# Patient Record
Sex: Female | Born: 1970 | Race: White | Hispanic: No | Marital: Married | Smoking: Never smoker
Health system: Southern US, Community
[De-identification: ages and names within clinical notes are randomized; demographics above are authoritative.]

## PROBLEM LIST (undated history)

## (undated) DIAGNOSIS — T7840XA Allergy, unspecified, initial encounter: Secondary | ICD-10-CM

## (undated) DIAGNOSIS — K219 Gastro-esophageal reflux disease without esophagitis: Secondary | ICD-10-CM

## (undated) HISTORY — DX: Gastro-esophageal reflux disease without esophagitis: K21.9

## (undated) HISTORY — PX: WISDOM TOOTH EXTRACTION: SHX21

## (undated) HISTORY — DX: Allergy, unspecified, initial encounter: T78.40XA

---

## 1994-07-27 HISTORY — PX: WRIST SURGERY: SHX841

## 1996-07-27 HISTORY — PX: SINUSOTOMY: SHX291

## 2000-03-25 ENCOUNTER — Other Ambulatory Visit: Admission: RE | Admit: 2000-03-25 | Discharge: 2000-03-25 | Payer: Self-pay | Admitting: Obstetrics and Gynecology

## 2002-10-02 ENCOUNTER — Encounter: Payer: Self-pay | Admitting: Obstetrics and Gynecology

## 2002-10-02 ENCOUNTER — Ambulatory Visit (HOSPITAL_COMMUNITY): Admission: RE | Admit: 2002-10-02 | Discharge: 2002-10-02 | Payer: Self-pay | Admitting: Obstetrics and Gynecology

## 2003-03-01 ENCOUNTER — Ambulatory Visit (HOSPITAL_COMMUNITY): Admission: RE | Admit: 2003-03-01 | Discharge: 2003-03-01 | Payer: Self-pay | Admitting: Obstetrics and Gynecology

## 2003-03-01 ENCOUNTER — Other Ambulatory Visit: Admission: RE | Admit: 2003-03-01 | Discharge: 2003-03-01 | Payer: Self-pay | Admitting: Obstetrics and Gynecology

## 2003-03-01 ENCOUNTER — Encounter: Payer: Self-pay | Admitting: Obstetrics and Gynecology

## 2003-09-18 ENCOUNTER — Inpatient Hospital Stay (HOSPITAL_COMMUNITY): Admission: RE | Admit: 2003-09-18 | Discharge: 2003-09-21 | Payer: Self-pay | Admitting: Obstetrics and Gynecology

## 2013-08-24 ENCOUNTER — Other Ambulatory Visit: Payer: Self-pay

## 2013-08-24 DIAGNOSIS — Z1231 Encounter for screening mammogram for malignant neoplasm of breast: Secondary | ICD-10-CM

## 2013-09-11 ENCOUNTER — Ambulatory Visit
Admission: RE | Admit: 2013-09-11 | Discharge: 2013-09-11 | Disposition: A | Discharge status: Home / Self Care | Payer: BC Managed Care – PPO | Source: Ambulatory Visit

## 2013-09-11 DIAGNOSIS — Z1231 Encounter for screening mammogram for malignant neoplasm of breast: Secondary | ICD-10-CM

## 2013-09-14 ENCOUNTER — Other Ambulatory Visit: Payer: Self-pay | Admitting: Obstetrics and Gynecology

## 2013-09-14 DIAGNOSIS — R928 Other abnormal and inconclusive findings on diagnostic imaging of breast: Secondary | ICD-10-CM

## 2013-09-27 ENCOUNTER — Ambulatory Visit
Admission: RE | Admit: 2013-09-27 | Discharge: 2013-09-27 | Disposition: A | Discharge status: Home / Self Care | Payer: BC Managed Care – PPO | Source: Ambulatory Visit | Attending: Obstetrics and Gynecology | Admitting: Obstetrics and Gynecology

## 2013-09-27 DIAGNOSIS — R928 Other abnormal and inconclusive findings on diagnostic imaging of breast: Secondary | ICD-10-CM

## 2014-08-03 ENCOUNTER — Ambulatory Visit (INDEPENDENT_AMBULATORY_CARE_PROVIDER_SITE_OTHER): Payer: BLUE CROSS/BLUE SHIELD | Admitting: Family Medicine

## 2014-08-03 VITALS — BP 112/68 | HR 61 | Temp 97.8°F | Resp 16 | Ht 65.25 in | Wt 160.0 lb

## 2014-08-03 DIAGNOSIS — R0602 Shortness of breath: Secondary | ICD-10-CM

## 2014-08-03 DIAGNOSIS — R05 Cough: Secondary | ICD-10-CM

## 2014-08-03 DIAGNOSIS — K122 Cellulitis and abscess of mouth: Secondary | ICD-10-CM

## 2014-08-03 DIAGNOSIS — R059 Cough, unspecified: Secondary | ICD-10-CM

## 2014-08-03 MED ORDER — ALBUTEROL SULFATE HFA 108 (90 BASE) MCG/ACT IN AERS: 2.0000 | INHALATION_SPRAY | RESPIRATORY_TRACT | Status: DC | PRN

## 2014-08-03 MED ORDER — AMOXICILLIN 400 MG/5ML PO SUSR: ORAL | Status: AC

## 2014-08-03 NOTE — Patient Instructions (Signed)
Uvulitis  Uvulitis is redness and soreness (inflammation) of the uvula. The uvula is the small tongue-shaped piece of tissue in the back of your mouth.   CAUSES  Infection is a common cause of uvulitis. Infection of the uvula can be either viral or bacterial. Infectious uvulitis usually only occurs in association with another condition, such as inflammation and infection of the mouth or throat.   Other causes of uvulitis include:  · Trauma to the uvula.  · Swelling from excess fluid buildup (edema), which may be an allergic reaction.  · Inhalation of irritants, such as chemical agents, smoke, or steam.  DIAGNOSIS  Your caregiver can usually diagnose uvulitis through a physical examination. Bacterial uvulitis can be diagnosed through the results of the growth of samples of bodily substances taken from your mouth (cultures).  HOME CARE INSTRUCTIONS   · Rest as much as possible.  · Young children may suck on frozen juice bars or frozen ice pops. Older children and adults may gargle with a warm or cold liquid to help soothe the throat. (Mix ¼ tsp of salt in 8 oz of water, or use strong tea.)  · Use a cool-mist humidifier to lessen throat irritation and cough.  · Drink enough fluids to keep your urine clear or pale yellow.  · While the throat is very sore, eat soft or liquid foods such as milk, ice cream, soups, or milk drinks.  · Family members who develop a sore throat or fever should have a medical exam or throat culture.  · If your child has uvulitis and is taking antibiotic medicine, wait 24 hours or until his or her temperature is near normal (less than 100° F [37.8° C]) before allowing him or her to return to school or day care.  · Only take over-the-counter or prescription medicines for pain, discomfort, or fever as directed by your caregiver.  Ask when your test results will be ready. Make sure you get your test results.   SEEK MEDICAL CARE IF:   · You have an oral temperature above 102° F (38.9° C).  · You  develop large, tender lumps your the neck.  · Your child develops a rash.  · You cough up green, yellow-brown, or bloody substances.  SEEK IMMEDIATE MEDICAL CARE IF:   · You develop any new symptoms, such as vomiting, earache, severe headache, stiff neck, chest pain, or trouble breathing or swallowing.  · Your airway is blocked.  · You develop more severe throat pain along with drooling or voice changes.  Document Released: 02/21/2004 Document Revised: 10/05/2011 Document Reviewed: 09/18/2010  ExitCare® Patient Information ©2015 [employer]. This information is not intended to replace advice given to you by your health care provider. Make sure you discuss any questions you have with your health care provider.

## 2014-08-03 NOTE — Progress Notes (Signed)
    MRN: [id number] DOB: 1971-07-14  Subjective:   Joan Leon is a 44 y.o. female presenting for 2 week history of cough worse at night productive with thick dark green sputum, wheezing, shob. On 07/[facility]/2014[facility] prednisone x5 days, albuterol inhaler (used every 4 hours with significant relief), benzonatate perles. Still has chest tightness, feels choking sensation, tickle in her throat. Associated symptoms include fatigue, fevers (highest 100F), sinus congestion, occasional chest pain, intermittent chest tightness and shob with cough. Denies sinus pain, ear pain, ear fullness, itchy or watery eyes, throat pain, n/v, abdominal pain, diarrhea. Admits seasonal allergies and is taking Claritin for this, denies history of asthma. Fiance has been sick, sinus infection, taking antibiotics; also 3 co-workers, one with very similar symptoms treated with antibiotics. Denies smoking, social alcohol use. Denies any other aggravating or relieving factors, no other questions or concerns.  Judeth CornfieldStephanie has a current medication list which includes the following prescription(s): benzonatate and loratadine.  She has No Known Allergies.  Judeth CornfieldStephanie  has no past medical history on file. Also  has past surgical history that includes Cesarean section.  ROS As in subjective.  Objective:   Vitals: BP 112/68 mmHg  Pulse 61  Temp(Src) 97.8 F (36.6 C) (Oral)  Resp 16  Ht 5' 5.25" (1.657 m)  Wt 160 lb (72.576 kg)  BMI 26.43 kg/m2  SpO2 99%  LMP 07/25/2014  Physical Exam  Constitutional: She is oriented to person, place, and time and well-developed, well-nourished, and in no distress.  HENT:  Right TM cerumen occluded and left TM pearly and intact, both non-tender, no drainage.  Nasal turbinates pink and moist, mild edema. Uvula inflamed and edematous, surround oropharynx erythematous. No tonsillar edema, oropharyngeal exudates or postnasal drip.  Eyes: Conjunctivae and EOM are  normal. Right eye exhibits no discharge. Left eye exhibits no discharge. No scleral icterus.  Neck: Normal range of motion.  Cardiovascular: Normal rate, regular rhythm, normal heart sounds and intact distal pulses.  Exam reveals no gallop and no friction rub.   No murmur heard. Pulmonary/Chest: Effort normal and breath sounds normal. No stridor. No respiratory distress. She has no wheezes. She has no rales. She exhibits no tenderness.  Abdominal: Bowel sounds are normal.  Lymphadenopathy:    She has no cervical adenopathy.  Neurological: She is alert and oriented to person, place, and time.  Skin: Skin is warm and dry. No rash noted. She is not diaphoretic. No erythema.  Psychiatric: Mood and affect normal.   Assessment and Plan :   1. Uvulitis 2. Cough 3. Shortness of breath - Uvulitis likely bacterial given time course and worsening symptoms, will treat empirically covering for Strep. Patient declined strep swab, requested liquid suspension of Amoxicillin. - Cough likely caused from inflammation/edema of uvula and surrounding oropharynx, continue benzonatate, call if refill needed - refill albuterol for shob, PE findings reassuring for r/o cardiopulmonary process, will hold of on X-ray given PE findings, empiric antibiotic tx, consider X-ray if no improvement in symptoms with antibiotic course.  Wallis BambergMario Temara Lanum, PA-C [facility] [phone] 08/03/2014 9:46 AM

## 2014-08-16 ENCOUNTER — Ambulatory Visit (INDEPENDENT_AMBULATORY_CARE_PROVIDER_SITE_OTHER): Payer: BLUE CROSS/BLUE SHIELD | Admitting: Family Medicine

## 2014-08-16 ENCOUNTER — Ambulatory Visit (INDEPENDENT_AMBULATORY_CARE_PROVIDER_SITE_OTHER): Payer: BLUE CROSS/BLUE SHIELD

## 2014-08-16 VITALS — BP 108/64 | HR 55 | Temp 98.3°F | Resp 16 | Ht 65.0 in | Wt 160.8 lb

## 2014-08-16 DIAGNOSIS — J209 Acute bronchitis, unspecified: Secondary | ICD-10-CM

## 2014-08-16 DIAGNOSIS — R059 Cough, unspecified: Secondary | ICD-10-CM

## 2014-08-16 DIAGNOSIS — R05 Cough: Secondary | ICD-10-CM

## 2014-08-16 DIAGNOSIS — R0602 Shortness of breath: Secondary | ICD-10-CM

## 2014-08-16 LAB — POCT CBC
Granulocyte percent: 64.3 %G (ref 37–80)
HCT, POC: 40.4 % (ref 37.7–47.9)
Hemoglobin: 13.2 g/dL (ref 12.2–16.2)
LYMPH, POC: 2.3 (ref 0.6–3.4)
MCH, POC: 30.7 pg (ref 27–31.2)
MCHC: 32.8 g/dL (ref 31.8–35.4)
MCV: 93.7 fL (ref 80–97)
MID (cbc): 0.4 (ref 0–0.9)
MPV: 6.2 fL (ref 0–99.8)
POC Granulocyte: 5 (ref 2–6.9)
POC LYMPH PERCENT: 30 %L (ref 10–50)
POC MID %: 5.7 %M (ref 0–12)
Platelet Count, POC: 355 10*3/uL (ref 142–424)
RBC: 4.32 M/uL (ref 4.04–5.48)
RDW, POC: 13.4 %
WBC: 7.7 10*3/uL (ref 4.6–10.2)

## 2014-08-16 MED ORDER — AZITHROMYCIN 250 MG PO TABS: ORAL_TABLET | ORAL | Status: DC

## 2014-08-16 MED ORDER — PREDNISONE 20 MG PO TABS: ORAL_TABLET | ORAL | Status: DC

## 2014-08-16 MED ORDER — BECLOMETHASONE DIPROPIONATE 40 MCG/ACT IN AERS: 2.0000 | INHALATION_SPRAY | Freq: Two times a day (BID) | RESPIRATORY_TRACT | Status: DC

## 2014-08-16 MED ORDER — ALBUTEROL SULFATE (2.5 MG/3ML) 0.083% IN NEBU
2.5000 mg | INHALATION_SOLUTION | Freq: Once | RESPIRATORY_TRACT | Status: AC
  Administered 2014-08-16: 2.5 mg via RESPIRATORY_TRACT

## 2014-08-16 MED ORDER — ALBUTEROL SULFATE HFA 108 (90 BASE) MCG/ACT IN AERS: 2.0000 | INHALATION_SPRAY | RESPIRATORY_TRACT | Status: DC | PRN

## 2014-08-16 MED ORDER — BENZONATATE 100 MG PO CAPS: 100.0000 mg | ORAL_CAPSULE | Freq: Three times a day (TID) | ORAL | Status: DC | PRN

## 2014-08-16 MED ORDER — HYDROCODONE-HOMATROPINE 5-1.5 MG/5ML PO SYRP: 5.0000 mL | ORAL_SOLUTION | ORAL | Status: DC | PRN

## 2014-08-16 NOTE — Progress Notes (Signed)
     Results for orders placed or performed in visit on 08/16/14  POCT CBC  Result Value Ref Range   WBC 7.7 4.6 - 10.2 K/uL   Lymph, poc 2.3 0.6 - 3.4   POC LYMPH PERCENT 30.0 10 - 50 %L   MID (cbc) 0.4 0 - 0.9   POC MID % 5.7 0 - 12 %M   POC Granulocyte 5.0 2 - 6.9   Granulocyte percent 64.3 37 - 80 %G   RBC 4.32 4.04 - 5.48 M/uL   Hemoglobin 13.2 12.2 - 16.2 g/dL   HCT, POC 09.840.4 11.937.7 - 47.9 %   MCV 93.7 80 - 97 fL   MCH, POC 30.7 27 - 31.2 pg   MCHC 32.8 31.8 - 35.4 g/dL   RDW, POC 14.713.4 %   Platelet Count, POC 355 142 - 424 K/uL   MPV 6.2 0 - 99.8 fL   [facility] reading (PRIMARY) by  Dr. Alwyn RenHopper Normal chest  Will treat with albuterol, steroids, Qvar inhaler, and azithromycin. Return if not significantly improved a week or 10 days from now or if getting worse at anytime.

## 2014-08-16 NOTE — Patient Instructions (Signed)
Drink plenty of fluids  Use the albuterol inhaler 2 inhalations every 4 hours when awake, gradually taper off of it when doing better  Use the Qvar inhaler 2 inhalations twice daily. Recommend continuing that for at least one month  Take the prednisone 3 daily for 2 days, 2 daily for 2 days, one daily for 2 days, one half daily for the remaining 4 days  Take the azithromycin 2 pills initially, then 1 daily for 4 days  Use the Tessalon cough pills if needed  Use Hycodan cough syrup 1 teaspoon every 4-6 hours as needed for cough. Will cause sedation  Return if not substantially improved in the next week or 2, or in any time if necessary.

## 2014-08-29 ENCOUNTER — Ambulatory Visit (INDEPENDENT_AMBULATORY_CARE_PROVIDER_SITE_OTHER): Payer: BLUE CROSS/BLUE SHIELD | Admitting: Physician Assistant

## 2014-08-29 VITALS — BP 108/68 | HR 66 | Temp 98.8°F | Resp 16 | Wt 161.2 lb

## 2014-08-29 DIAGNOSIS — R05 Cough: Secondary | ICD-10-CM

## 2014-08-29 DIAGNOSIS — R059 Cough, unspecified: Secondary | ICD-10-CM

## 2014-08-29 DIAGNOSIS — J029 Acute pharyngitis, unspecified: Secondary | ICD-10-CM

## 2014-08-29 LAB — POCT RAPID STREP A (OFFICE): Rapid Strep A Screen: NEGATIVE

## 2014-08-29 MED ORDER — MAGIC MOUTHWASH W/LIDOCAINE: 10.0000 mL | ORAL | Status: DC | PRN

## 2014-08-29 MED ORDER — OMEPRAZOLE 40 MG PO CPDR: 40.0000 mg | DELAYED_RELEASE_CAPSULE | Freq: Two times a day (BID) | ORAL | Status: DC

## 2014-08-29 MED ORDER — HYDROCOD POLST-CHLORPHEN POLST 10-8 MG/5ML PO LQCR: 5.0000 mL | Freq: Two times a day (BID) | ORAL | Status: DC | PRN

## 2014-08-29 MED ORDER — RANITIDINE HCL 150 MG PO TABS: 150.0000 mg | ORAL_TABLET | Freq: Two times a day (BID) | ORAL | Status: DC

## 2014-08-29 NOTE — Patient Instructions (Signed)
Continue the Claritin, and the inhalers. Get as much rest and drink as much water as you can.  Things that often make reflux symptoms worse: Caffeine Carbonation (soda) Mint Spicy foods Acidic foods (like tomato sauce, orange juice, lemonade) Fatty foods (including whole milk and ice cream) Stress (feeling sad, worried, nervous) Nicotine Alcohol Nonsteroidal anti-inflammatories (NSAIDS)(ibuprofen, naproxen)

## 2014-08-29 NOTE — Progress Notes (Signed)
Subjective:    Patient ID: Joan Leon, female    DOB: [birth year], 44 y.o.   MRN: [id number]   PCP: No PCP Per Patient  Chief Complaint  Patient presents with  . Cough    worse at night  . Fever    chills during the day  . Fatigue    extreme fatigue   . Sore Throat    2-3 days    No Known Allergies  There are no active problems to display for this patient.   Prior to Admission medications   Medication Sig Start Date End Date Taking? Authorizing Provider  albuterol (PROVENTIL HFA;VENTOLIN HFA) 108 (90 BASE) MCG/ACT inhaler Inhale 2 puffs into the lungs every 4 (four) hours as needed for wheezing or shortness of breath (cough, shortness of breath or wheezing.). 08/16/14  Yes Peyton Najjar, MD  beclomethasone (QVAR) 40 MCG/ACT inhaler Inhale 2 puffs into the lungs 2 (two) times daily. 08/16/14  Yes Peyton Najjar, MD  HYDROcodone-homatropine [facility]) 5-1.5 MG/5ML syrup Take 5 mLs by mouth every 4 (four) hours as needed. 08/16/14  Yes Peyton Najjar, MD  loratadine (CLARITIN) 10 MG tablet Take 10 mg by mouth daily.   Yes Historical Provider, MD    Medical, Surgical, Family and Social History reviewed and updated.  HPI  Presents with persistent cough, beginning in December 2015.  The past few nights, she's had to take a double dose of hycodan, and even that only helped a little. Uses [place] cough drops all night. Not as bad during the day, but any kind of attempt at exercise and just going outside exacerbates the cough. She's developed a sore throat and headache, and chest soreness with so much coughing. Uses the prescribed inhalers and can tell when it's been 4 hours since the last albuterol dose because she develops chest tightness. No head congestion, but she describes "non-stop drainage" in her throat.  Some chills. No fever. No nausea, vomiting, diarrhea. No heartburn/indigestion, increased belching.  She is under considerable work stress with very long days. Also  planning her April wedding and honeymoon to [place].  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. On 12/30 she was in [place] with her fiance and presented to a [facility] for evaluation. She was diagnosed with a "bronchial infection" and prescribed analbuterol inhaler, prednisone taper and tessalon perles.  When she did not improve, she presented here on 08/03/2014. At that time, she reported thick dark green sputum, wheezing and SOB. She described chest tightness, choking sensation and tickle in her throat. She also reported fatigue, fever (Tmax 100), sinus congestion. Multiple sick contacts, all improved with antibiotic therapy. On exam, the nasal turbinates were pink and edematous and the uvula was inflamed and edematous. She was diagnosed with uvulitis, and treated empirically with amoxicillin. Albuterol inhaler was refilled, as she found it helpful temporarily.  She returned 08/16/2014 with persistent symptoms. CBC was normal. CXR was reported as normal, but she says she was told that it was "not quite pneumonia." A nebulizer treatment helped considerably. She was prescribed azithromycin, a steroid taper, Qvar inhaler and advised to continue the albuterol inhaler. She was advised to RTC if her symptoms were still present in 10 days.  Review of Systems As above.    Objective:   Physical Exam  Constitutional: She is oriented to person, place, and time. Vital signs are normal. She appears well-developed and well-nourished.  No distress.  BP 108/68 mmHg  Pulse 66  Temp(Src) 98.8 F (37.1 C) (Oral)  Resp 16  Wt 161 lb 3.2 oz (73.12 kg)  SpO2 98%  LMP 08/12/2014   HENT:  Head: Normocephalic and atraumatic.  Right Ear: Hearing, tympanic membrane, external ear and ear canal normal.  Left Ear: Hearing, tympanic membrane, external ear and ear canal normal.  Nose: Mucosal edema (mild) present.  No foreign bodies. Right  sinus exhibits no maxillary sinus tenderness and no frontal sinus tenderness. Left sinus exhibits no maxillary sinus tenderness and no frontal sinus tenderness.  Mouth/Throat: Uvula is midline and mucous membranes are normal. Normal dentition. No uvula swelling. Posterior oropharyngeal erythema (soft palate and uvula with mild erythema) present. No oropharyngeal exudate, posterior oropharyngeal edema or tonsillar abscesses.  Moderate cerumen in the canals bilaterally, but visible TMs are normal.  Eyes: Conjunctivae and EOM are normal. Pupils are equal, round, and reactive to light. Right eye exhibits no discharge. Left eye exhibits no discharge. No scleral icterus.  Neck: Trachea normal, normal range of motion, full passive range of motion without pain and phonation normal. Neck supple. No thyroid mass and no thyromegaly present.  Cardiovascular: Normal rate, regular rhythm and normal heart sounds.   Pulmonary/Chest: Effort normal and breath sounds normal.  Last albuterol dose about 3.5 hours ago  Lymphadenopathy:       Head (right side): No submandibular, no tonsillar, no preauricular, no posterior auricular and no occipital adenopathy present.       Head (left side): No submandibular, no tonsillar, no preauricular and no occipital adenopathy present.    She has no cervical adenopathy.       Right: No supraclavicular adenopathy present.       Left: No supraclavicular adenopathy present.  Neurological: She is alert and oriented to person, place, and time. She has normal strength. No cranial nerve deficit or sensory deficit.  Skin: Skin is warm, dry and intact. No rash noted.  Psychiatric: She has a normal mood and affect. Her speech is normal and behavior is normal.          Assessment & Plan:  1. Acute pharyngitis, unspecified pharyngitis type 2. Cough Suspicious of LPR as cause for cough, given appropriate antibiotic therapy and lack of improvement with prednisone. She has underlying  allergies, but prednisone should have improved symptoms if due to post-nasal drainage. Also consider cyclic cough. Start PPI, and H2 blocker until symptoms improving. Trial of GI cocktail with lidocaine to see if we can suppress the sensation in her throat that she needs to cough. Also suppress cough with Tussionex. Rest. Fluids. Reviewed reflux triggers to avoid (stress is really the only item on the list she identifies as present in her life). If no improvement, plan referral to pulmonology.  - POCT rapid strep A - Culture, Group A Strep - Alum & Mag Hydroxide-Simeth (MAGIC MOUTHWASH W/LIDOCAINE) SOLN; Take 10 mLs by mouth every 2 (two) hours as needed for mouth pain.  Dispense: 360 mL; Refill: 0 - chlorpheniramine-HYDROcodone (TUSSIONEX PENNKINETIC ER) 10-8 MG/5ML LQCR; Take 5 mLs by mouth every 12 (twelve) hours as needed for cough (cough).  Dispense: 100 mL; Refill: 0 - ranitidine (ZANTAC) 150 MG tablet; Take 1 tablet (150 mg total) by mouth 2 (two) times daily. Once symptoms improve, reduce this to once daily, then stop.  Dispense: 60 tablet; Refill: 0 - omeprazole (PRILOSEC) 40 MG capsule; Take 1 capsule (40 mg total) by mouth 2 (two) times daily.  Dispense:  60 capsule; Refill: 3   Discussed with Dr. Neva SeatGreene.  Fernande Brashelle S. Lam Bjorklund, PA-C Physician Assistant-Certified [facility]

## 2014-09-01 LAB — CULTURE, GROUP A STREP: ORGANISM ID, BACTERIA: NORMAL

## 2015-02-28 ENCOUNTER — Ambulatory Visit (INDEPENDENT_AMBULATORY_CARE_PROVIDER_SITE_OTHER): Payer: BLUE CROSS/BLUE SHIELD | Admitting: Physician Assistant

## 2015-02-28 ENCOUNTER — Ambulatory Visit (INDEPENDENT_AMBULATORY_CARE_PROVIDER_SITE_OTHER): Payer: BLUE CROSS/BLUE SHIELD

## 2015-02-28 VITALS — BP 110/58 | HR 73 | Temp 98.1°F | Resp 18 | Ht 65.0 in | Wt 162.0 lb

## 2015-02-28 DIAGNOSIS — R05 Cough: Secondary | ICD-10-CM

## 2015-02-28 DIAGNOSIS — R0602 Shortness of breath: Secondary | ICD-10-CM | POA: Diagnosis not present

## 2015-02-28 DIAGNOSIS — R053 Chronic cough: Secondary | ICD-10-CM

## 2015-02-28 MED ORDER — ALBUTEROL SULFATE HFA 108 (90 BASE) MCG/ACT IN AERS: 2.0000 | INHALATION_SPRAY | RESPIRATORY_TRACT | Status: DC | PRN

## 2015-02-28 MED ORDER — HYDROCOD POLST-CPM POLST ER 10-8 MG/5ML PO SUER: 5.0000 mL | Freq: Every evening | ORAL | Status: DC | PRN

## 2015-02-28 MED ORDER — E-Z SPACER DEVI: Status: DC

## 2015-02-28 NOTE — Progress Notes (Signed)
Subjective:    Patient ID: Joan Leon, female    DOB: 1971-07-27, 44 y.o.   MRN: [id number]  HPI Patient presents for cough that has been present for the past 8 months. Has been treated for URI and bronchitis with only marginal relief of cough. Sx of congestion, myalgias, HA, sinus pressure, sore throat, fever all improved initially and just had productive cough around March, however, in May 2016 initial sx returned. Now just has cough. Cough is constant throughout day and has caused SOB and has irritated throat. Cough is productive at times. Only additionally endorses fatigue and wheezing at times. Is taking claritin and albuterol inhaler. No longer has hycodan or tussionex. Most recent sick contact were ppl on the airplane. Denies HTN, GERD, or CHF. Is not a smoker.   Review of Systems  Constitutional: Positive for fatigue. Negative for fever, chills, activity change and appetite change.  HENT: Negative for congestion, ear discharge, ear pain, rhinorrhea, sinus pressure and sneezing.   Respiratory: Positive for cough, shortness of breath and wheezing. Negative for apnea and chest tightness.   Cardiovascular: Negative for chest pain, palpitations and leg swelling.  Gastrointestinal: Negative for nausea and vomiting.  Skin: Negative.   Neurological: Negative for dizziness, light-headedness and headaches.       Objective:   Physical Exam  Constitutional: She is oriented to person, place, and time. She appears well-developed and well-nourished. No distress.  Blood pressure 110/58, pulse 73, temperature 98.1 F (36.7 C), temperature source Oral, resp. rate 18, height  (1.651 m), weight 162 lb (73.483 kg), last menstrual period 02/14/2015, SpO2 99 %, peak flow 250 L/min.   HENT:  Head: Normocephalic and atraumatic.  Right Ear: Tympanic membrane, external ear and ear canal normal.  Left Ear: Tympanic membrane, external ear and ear canal normal.  Nose: No rhinorrhea (with  erythema). Right sinus exhibits no maxillary sinus tenderness and no frontal sinus tenderness. Left sinus exhibits no maxillary sinus tenderness and no frontal sinus tenderness.  Mouth/Throat: Uvula is midline, oropharynx is clear and moist and mucous membranes are normal. No oropharyngeal exudate.  Eyes: Conjunctivae are normal. Pupils are equal, round, and reactive to light. Right eye exhibits no discharge. Left eye exhibits no discharge. No scleral icterus.  Neck: Normal range of motion. Neck supple. No JVD present. Carotid bruit is not present. No thyromegaly present.  Cardiovascular: Normal rate, regular rhythm and normal heart sounds.  Exam reveals no gallop and no friction rub.   No murmur heard. Pulmonary/Chest: Effort normal and breath sounds normal. No respiratory distress. She has no decreased breath sounds. She has no wheezes. She has no rhonchi. She has no rales.  Abdominal: Soft. Bowel sounds are normal. She exhibits no distension. There is no tenderness. There is no rebound and no guarding.  Lymphadenopathy:    She has no cervical adenopathy.  Neurological: She is alert and oriented to person, place, and time.  Skin: Skin is warm and dry. No rash noted. She is not diaphoretic. No erythema.   Office Spirometry Results: Peak Flow: 250 L/MIN FEV1: 2.21 liters FVC: 2.73 liters FEV1/FVC: 81 % FVC  % Predicted: 73 liters FEV % Predicted: 74 liters FeF 25-75: 2.24 liters FeF 25-75 % Predicted: 74  [facility] reading (PRIMARY) by  Dr. Dareen Piano. No change from previous CXR. Negative.    Assessment & Plan:  1. Persistent cough for 3 weeks or longer 2. Shortness of breath - DG Chest 2 View; Future - Ambulatory referral to  Pulmonology - chlorpheniramine-HYDROcodone (TUSSIONEX PENNKINETIC ER) 10-8 MG/5ML SUER; Take 5 mLs by mouth at bedtime as needed for cough.  Dispense: 75 mL; Refill: 0 - albuterol (PROVENTIL HFA;VENTOLIN HFA) 108 (90 BASE) MCG/ACT inhaler; Inhale 2 puffs into the lungs  every 4 (four) hours as needed for wheezing or shortness of breath (cough, shortness of breath or wheezing.).  Dispense: 1 Inhaler; Refill: 1 - Spacer/Aero-Holding Chambers (E-Z SPACER) inhaler; Use as instructed  Dispense: 1 each; Refill: 2    Janan Ridge PA-C  [facility] 02/28/2015 4:46 PM

## 2015-03-07 NOTE — Progress Notes (Signed)
  Medical screening examination/treatment/procedure(s) were performed by non-physician practitioner and as supervising physician I was immediately available for consultation/collaboration.     

## 2015-03-29 ENCOUNTER — Telehealth: Payer: Self-pay | Admitting: Pulmonary Disease

## 2015-03-29 ENCOUNTER — Ambulatory Visit (INDEPENDENT_AMBULATORY_CARE_PROVIDER_SITE_OTHER): Payer: BLUE CROSS/BLUE SHIELD | Admitting: Pulmonary Disease

## 2015-03-29 ENCOUNTER — Encounter: Payer: Self-pay | Admitting: Pulmonary Disease

## 2015-03-29 ENCOUNTER — Other Ambulatory Visit (INDEPENDENT_AMBULATORY_CARE_PROVIDER_SITE_OTHER): Payer: BLUE CROSS/BLUE SHIELD

## 2015-03-29 VITALS — BP 98/60 | HR 67 | Ht 67.0 in | Wt 164.0 lb

## 2015-03-29 DIAGNOSIS — R0600 Dyspnea, unspecified: Secondary | ICD-10-CM

## 2015-03-29 DIAGNOSIS — R05 Cough: Secondary | ICD-10-CM | POA: Diagnosis not present

## 2015-03-29 DIAGNOSIS — R059 Cough, unspecified: Secondary | ICD-10-CM

## 2015-03-29 DIAGNOSIS — J309 Allergic rhinitis, unspecified: Secondary | ICD-10-CM

## 2015-03-29 DIAGNOSIS — K219 Gastro-esophageal reflux disease without esophagitis: Secondary | ICD-10-CM | POA: Diagnosis not present

## 2015-03-29 LAB — CBC WITH DIFFERENTIAL/PLATELET
BASOS ABS: 0.1 10*3/uL (ref 0.0–0.1)
Basophils Relative: 0.7 % (ref 0.0–3.0)
EOS PCT: 1.1 % (ref 0.0–5.0)
Eosinophils Absolute: 0.1 10*3/uL (ref 0.0–0.7)
HCT: 40.3 % (ref 36.0–46.0)
Hemoglobin: 13.5 g/dL (ref 12.0–15.0)
Lymphocytes Relative: 25.7 % (ref 12.0–46.0)
Lymphs Abs: 2 10*3/uL (ref 0.7–4.0)
MCHC: 33.5 g/dL (ref 30.0–36.0)
MCV: 90.8 fl (ref 78.0–100.0)
MONO ABS: 0.5 10*3/uL (ref 0.1–1.0)
MONOS PCT: 6.6 % (ref 3.0–12.0)
NEUTROS PCT: 65.9 % (ref 43.0–77.0)
Neutro Abs: 5.2 10*3/uL (ref 1.4–7.7)
Platelets: 303 10*3/uL (ref 150.0–400.0)
RBC: 4.44 Mil/uL (ref 3.87–5.11)
RDW: 13.5 % (ref 11.5–15.5)
WBC: 7.9 10*3/uL (ref 4.0–10.5)

## 2015-03-29 MED ORDER — RANITIDINE HCL 150 MG PO TABS: 150.0000 mg | ORAL_TABLET | Freq: Every day | ORAL | Status: DC

## 2015-03-29 MED ORDER — BUDESONIDE-FORMOTEROL FUMARATE 80-4.5 MCG/ACT IN AERO: 2.0000 | INHALATION_SPRAY | Freq: Two times a day (BID) | RESPIRATORY_TRACT | Status: DC

## 2015-03-29 NOTE — Progress Notes (Signed)
Subjective:    Patient ID: Joan Leon, female    DOB: 1971-02-23, 44 y.o.   MRN: [id number]  HPI She reports that a couple of years ago she was doing triathlons and able to swim. She reports her endurance has significantly decreased since her illness began. She reports after she traveled to [place] in December 2015 she had a fever and was treated with Amoxicillin. She reports she wsa tried on multiple other cough medications without any significant improvement in her symptoms. Cough is only very rarely productive. She reports she was started on multiple inhaled medications without improvement. She reports her cough completely resolved with the "Magic Mouthwash". She feels this was because the "tickle" in her throat resolved. She still had "tightness" in her chest despite improvement in her cough. However, her cough has persisted. She reports coughing throughout the night and it does wake her up. She reports she coughs more in the morning but this continues throughout the day. She has continued to take Qvar. She also reports that her albuterol inhaler helps with her chest tightness but doesn't make her cough go away. She has had intermittent wheezing, especially with exercise. She reports her cough is triggered by exposure to heat & humidity. She has intermittent reflux a couple of times a week. She notices this more with increased stress. She denies any morning brash water taste in her mouth. Previously she was on Zantac and thought it may have helped. She reports she has had some sinus congestion, pressure, and drainage intermittently throughout the last 8 months. Reports she did have allergy testing today and was sensitive to mold. As a child she was also sensitive to grasses and had seasonal allergies. She reports she didn't have asthma. She reports she did get bronchitis nearly every year. She denies any joint pain, stiffness, or swelling. No dry eyes, dry mouth, or oral ulcers.  Review of Systems No  rashes or bruising. No fever, chills, or sweats recently. A pertinent 14 point review of systems is negative except as per the history of presenting illness.  No Known Allergies  Current Outpatient Prescriptions on File Prior to Visit  Medication Sig Dispense Refill  . albuterol (PROVENTIL HFA;VENTOLIN HFA) 108 (90 BASE) MCG/ACT inhaler Inhale 2 puffs into the lungs every 4 (four) hours as needed for wheezing or shortness of breath (cough, shortness of breath or wheezing.). 1 Inhaler 1  . beclomethasone (QVAR) 40 MCG/ACT inhaler Inhale 2 puffs into the lungs 2 (two) times daily. 1 Inhaler 1  . Spacer/Aero-Holding Chambers (E-Z SPACER) inhaler Use as instructed 1 each 2  . Alum & Mag Hydroxide-Simeth (MAGIC MOUTHWASH W/LIDOCAINE) SOLN Take 10 mLs by mouth every 2 (two) hours as needed for mouth pain. (Patient not taking: Reported on 02/28/2015) 360 mL 0  . chlorpheniramine-HYDROcodone (TUSSIONEX PENNKINETIC ER) 10-8 MG/5ML SUER Take 5 mLs by mouth at bedtime as needed for cough. (Patient not taking: Reported on 03/29/2015) 75 mL 0  . HYDROcodone-homatropine (HYCODAN) 5-1.5 MG/5ML syrup Take 5 mLs by mouth every 4 (four) hours as needed. (Patient not taking: Reported on 02/28/2015) 120 mL 0  . loratadine (CLARITIN) 10 MG tablet Take 10 mg by mouth daily.    Marland Kitchen omeprazole (PRILOSEC) 40 MG capsule Take 1 capsule (40 mg total) by mouth 2 (two) times daily. (Patient not taking: Reported on 02/28/2015) 60 capsule 3  . ranitidine (ZANTAC) 150 MG tablet Take 1 tablet (150 mg total) by mouth 2 (two) times daily. Once symptoms improve, reduce  this to once daily, then stop. (Patient not taking: Reported on 02/28/2015) 60 tablet 0   No current facility-administered medications on file prior to visit.   Past Medical History  Diagnosis Date  . Allergy   . GERD (gastroesophageal reflux disease)    Past Surgical History  Procedure Laterality Date  . Cesarean section    . Wrist surgery Left 1996  . Sinusotomy  1998    . Wisdom tooth extraction     Family History  Problem Relation Age of Onset  . Cancer Mother 51    breast  . Scoliosis Mother   . Osteoporosis Mother   . Sjogren's syndrome Mother   . Rheum arthritis Mother   . Cancer Father 63    prostate  . Diabetes Father   . Scoliosis Brother   . ADD / ADHD Son   . Mental illness Brother   . Scoliosis Brother   . Asthma Brother   . Asthma Maternal Aunt   . Asthma Maternal Grandmother   . Rheum arthritis Maternal Grandmother    Social History   Social History  . Marital Status: Single    Spouse Name: engaged  . Number of Children: 1  . Years of Education: law degree   Occupational History  . attorney    Social History Main Topics  . Smoking status: Never Smoker   . Smokeless tobacco: Never Used  . Alcohol Use: 0.0 oz/week    0 Standard drinks or equivalent per week     Comment: occasionally on weekends  . Drug Use: No  . Sexual Activity: Not Asked   Other Topics Concern  . None   Social History Narrative   Originally from [place], [place]. She has lived in [place] & [place]. She moved to [facility] from [phone] and then moved back in 1997. 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 She has a rabbit in her son's room. No bird exposure. She denies any mold or mildew in her house or prior apartment. She reports that while living with her dad for 5 years until 2012 he had a leaky roof with mold also in her room. No hot tub exposure. Enjoys hiking & bicycling. She also does swimming.    Previously has traveled to [place] in April 2016.       Objective:   Physical Exam (Exam performed with chaperone) Blood pressure 98/60, pulse 67, height 5\' 7"  (1.702 m), weight 164 lb (74.39 kg), last menstrual period 02/14/2015, SpO2 99 %. General:  Awake. Alert. No acute distress. Fit Caucasian female.  Integument:  Warm & dry. No rash on exposed skin. No bruising. Lymphatics:  No appreciated cervical or supraclavicular  lymphadenoapthy. HEENT:  Moist mucus membranes. No oral ulcers. No scleral injection or icterus. Minimal nasal turbinate swelling. PERRL. Cardiovascular:  Regular rate. No edema. No appreciable JVD.  Pulmonary:  Good aeration & clear to auscultation bilaterally. Symmetric chest wall expansion. No accessory muscle use on room air. Abdomen: Soft. Normal bowel sounds. Nondistended. Grossly nontender. Musculoskeletal:  Normal bulk and tone. Hand grip strength 5/5 bilaterally. No joint deformity or effusion appreciated. Neurological:  CN 2-12 grossly in tact. No meningismus. Moving all 4 extremities equally. Symmetric patellar deep tendon reflexes. Psychiatric:  Mood and affect congruent. Speech normal rhythm, rate & tone.   PFT 02/28/15: FVC 2.73 L (73%) FEV1 2.21 L (74%) FEV1/FVC 0.81 FEF 25-75 2.24 L (74%)  IMAGING CXR PA/LAT 02/28/15 (personally reviewed by me):  Mild hyperinflation with deep sulci bilaterally. No pleural effusion or thickening appreciated. No parenchymal nodule or opacity appreciated. Heart normal in size. Mediastinum normal in contour. Chest x-rays unchanged when compared with prior chest x-ray from January 2016.   LABS 08/16/14 CBC: 7.7/13.2/40.4/355  08/29/14 Rapid strep: Negative/normal upper respiratory flora    Assessment & Plan:  44 year old Caucasian female with multiple family members having asthma as well as a personal history of recurrent yearly bronchitis as a child. She has had a persistent intermittent cough as well as chest tightness for the last 8 months or so. Her cough seems to be relieved symptomatically with use of Magic mouthwash which I feel is quite probably due to the lidocaine contained within it. This raises my suspicion for contribution of silent laryngo-esophageal reflux in addition to the reflux she is appreciating. Her clinical picture is one that would be highly consistent with underlying asthma despite her normal spirometry. This will be investigated  further. Given her environmental allergies I am in agreement with Dr. [place] Callas that Singulair and levocetirizine may give her great symptom relief. In addition I feel that switching over to a long-acting beta agonist may provide her some relief as well. To that end I'm switching her over to Symbicort from Qvar. I instructed her to contact my office if she had any further questions or concerns before her next appointment.  1. Cough: Suspect underlying asthma. Evaluating accordingly. Also relayed initiating treatment of underlying GERD. Patient to notify me if her cough becomes productive. Holding off on CT scan of the chest at this time. Also deferring workup for autoimmune disease at this time given absence of symptoms. 2. Dyspnea: Checking full pulmonary function testing as well as methacholine challenge test. Switching from Qvar to Symbicort 80/4.5 with a spacer. Patient will continue using her albuterol inhaler as needed. 3. GERD: Restarting Zantac 150 milligrams by mouth daily at bedtime. Checking barium swallow. 4. Allergic rhinitis: Patient being started on Flonase, Levaquin cetirizine, & Singulair by Dr. [place] Callas. She will continue with this regimen. Checking serum IgE & CBC with differential for eosinophil count. 5. Follow-up: Patient will return to clinic in 4-6 weeks or sooner if needed.

## 2015-03-29 NOTE — Telephone Encounter (Signed)
PFT 02/28/15: FVC 2.73 L (73%) FEV1 2.21 L (74%) FEV1/FVC 0.81 FEF 25-75 2.24 L (74%)  IMAGING CXR PA/LAT 02/28/15 (personally reviewed by me): Mild hyperinflation with deep sulci bilaterally. No pleural effusion or thickening appreciated. No parenchymal nodule or opacity appreciated. Heart normal in size. Mediastinum normal in contour. Chest x-rays unchanged when compared with prior chest x-ray from January 2016.   LABS 08/16/14 CBC: 7.7/13.2/40.4/355  08/29/14 Rapid strep: Negative/normal upper respiratory flora

## 2015-03-29 NOTE — Patient Instructions (Signed)
1.  We are checking blood work with regards to your possible asthma. 2.  I am having you scheduled for a complete set of breathing tests as well as a methacholine challenge test to evaluate you for asthma. 3.  You will restart Zantac at night to help with any potential reflux and they will schedule you for a swallowing study to evaluate your reflux. 4.  I am switching you off of Qvar over to Symbicort. You should use this with your spacer. You can still use your albuterol inhaler 2 puffs every  4 hours as needed. 5.  You can go ahead and start the Levocetirizine, Montelukast & Flonase prescribed by Dr. [place] Callas. 6.  I will see you back in 4-6 weeks but please call if you have any questions or new problems.

## 2015-04-01 LAB — IGE: IgE (Immunoglobulin E), Serum: 98 kU/L (ref <115)

## 2015-04-04 ENCOUNTER — Ambulatory Visit (HOSPITAL_COMMUNITY)
Admission: RE | Admit: 2015-04-04 | Discharge: 2015-04-04 | Disposition: A | Discharge status: Home / Self Care | Payer: BLUE CROSS/BLUE SHIELD | Source: Ambulatory Visit | Attending: Pulmonary Disease | Admitting: Pulmonary Disease

## 2015-04-04 DIAGNOSIS — R059 Cough, unspecified: Secondary | ICD-10-CM

## 2015-04-04 DIAGNOSIS — R05 Cough: Secondary | ICD-10-CM | POA: Insufficient documentation

## 2015-04-04 DIAGNOSIS — R0600 Dyspnea, unspecified: Secondary | ICD-10-CM | POA: Diagnosis not present

## 2015-04-04 DIAGNOSIS — R933 Abnormal findings on diagnostic imaging of other parts of digestive tract: Secondary | ICD-10-CM | POA: Insufficient documentation

## 2015-04-04 LAB — PULMONARY FUNCTION TEST
FEF 25-75 Post: 2.2 L/sec
FEF 25-75 Pre: 2.32 L/sec
FEF2575-%CHANGE-POST: -4 %
FEF2575-%PRED-POST: 73 %
FEF2575-%Pred-Pre: 77 %
FEV1-%Change-Post: 0 %
FEV1-%PRED-POST: 79 %
FEV1-%Pred-Pre: 80 %
FEV1-Post: 2.39 L
FEV1-Pre: 2.41 L
FEV1FVC-%CHANGE-POST: 0 %
FEV1FVC-%PRED-PRE: 98 %
FEV6-%Change-Post: 0 %
FEV6-%Pred-Post: 82 %
FEV6-%Pred-Pre: 82 %
FEV6-Post: 3.01 L
FEV6-Pre: 3.01 L
FEV6FVC-%Pred-Post: 102 %
FEV6FVC-%Pred-Pre: 102 %
FVC-%CHANGE-POST: 0 %
FVC-%Pred-Post: 80 %
FVC-%Pred-Pre: 80 %
FVC-POST: 3.01 L
FVC-PRE: 3.01 L
Post FEV1/FVC ratio: 79 %
Post FEV6/FVC ratio: 100 %
Pre FEV1/FVC ratio: 80 %
Pre FEV6/FVC Ratio: 100 %

## 2015-04-04 MED ORDER — SODIUM CHLORIDE 0.9 % IN NEBU
3.0000 mL | INHALATION_SOLUTION | Freq: Once | RESPIRATORY_TRACT | Status: AC
  Administered 2015-04-04: 3 mL via RESPIRATORY_TRACT

## 2015-04-04 MED ORDER — METHACHOLINE 1 MG/ML NEB SOLN
2.0000 mL | Freq: Once | RESPIRATORY_TRACT | Status: AC
  Administered 2015-04-04: 2 mg via RESPIRATORY_TRACT

## 2015-04-04 MED ORDER — METHACHOLINE 16 MG/ML NEB SOLN
2.0000 mL | Freq: Once | RESPIRATORY_TRACT | Status: AC
  Administered 2015-04-04: 32 mg via RESPIRATORY_TRACT

## 2015-04-04 MED ORDER — ALBUTEROL SULFATE (2.5 MG/3ML) 0.083% IN NEBU
2.5000 mg | INHALATION_SOLUTION | Freq: Once | RESPIRATORY_TRACT | Status: AC
  Administered 2015-04-04: 2.5 mg via RESPIRATORY_TRACT

## 2015-04-04 MED ORDER — METHACHOLINE 4 MG/ML NEB SOLN
2.0000 mL | Freq: Once | RESPIRATORY_TRACT | Status: AC
  Administered 2015-04-04: 8 mg via RESPIRATORY_TRACT

## 2015-04-04 MED ORDER — METHACHOLINE 0.25 MG/ML NEB SOLN
2.0000 mL | Freq: Once | RESPIRATORY_TRACT | Status: AC
  Administered 2015-04-04: 0.5 mg via RESPIRATORY_TRACT

## 2015-04-04 MED ORDER — METHACHOLINE 0.0625 MG/ML NEB SOLN
2.0000 mL | Freq: Once | RESPIRATORY_TRACT | Status: AC
  Administered 2015-04-04: 0.125 mg via RESPIRATORY_TRACT

## 2015-04-05 ENCOUNTER — Ambulatory Visit (HOSPITAL_COMMUNITY)
Admission: RE | Admit: 2015-04-05 | Discharge: 2015-04-05 | Disposition: A | Discharge status: Home / Self Care | Payer: BLUE CROSS/BLUE SHIELD | Source: Ambulatory Visit | Attending: Pulmonary Disease | Admitting: Pulmonary Disease

## 2015-04-05 ENCOUNTER — Encounter (HOSPITAL_COMMUNITY)
Admission: RE | Admit: 2015-04-05 | Discharge: 2015-04-05 | Disposition: A | Discharge status: Home / Self Care | Payer: BLUE CROSS/BLUE SHIELD | Source: Ambulatory Visit | Attending: Pulmonary Disease | Admitting: Pulmonary Disease

## 2015-04-05 DIAGNOSIS — R059 Cough, unspecified: Secondary | ICD-10-CM

## 2015-04-05 DIAGNOSIS — R0600 Dyspnea, unspecified: Secondary | ICD-10-CM | POA: Diagnosis not present

## 2015-04-05 DIAGNOSIS — R933 Abnormal findings on diagnostic imaging of other parts of digestive tract: Secondary | ICD-10-CM | POA: Diagnosis not present

## 2015-04-05 DIAGNOSIS — R05 Cough: Secondary | ICD-10-CM

## 2015-04-05 LAB — PULMONARY FUNCTION TEST
DL/VA % PRED: 101 %
DL/VA: 5.02 ml/min/mmHg/L
DLCO unc % pred: 83 %
DLCO unc: 21.42 ml/min/mmHg
FEF 25-75 Post: 2.49 L/sec
FEF 25-75 Pre: 2.34 L/sec
FEF2575-%CHANGE-POST: 6 %
FEF2575-%PRED-PRE: 78 %
FEF2575-%Pred-Post: 83 %
FEV1-%CHANGE-POST: 0 %
FEV1-%PRED-POST: 83 %
FEV1-%PRED-PRE: 82 %
FEV1-PRE: 2.48 L
FEV1-Post: 2.49 L
FEV1FVC-%CHANGE-POST: 1 %
FEV1FVC-%Pred-Pre: 98 %
FEV6-%CHANGE-POST: -1 %
FEV6-%PRED-PRE: 84 %
FEV6-%Pred-Post: 83 %
FEV6-PRE: 3.1 L
FEV6-Post: 3.06 L
FEV6FVC-%PRED-PRE: 102 %
FEV6FVC-%Pred-Post: 102 %
FVC-%CHANGE-POST: -1 %
FVC-%PRED-PRE: 82 %
FVC-%Pred-Post: 81 %
FVC-POST: 3.06 L
FVC-PRE: 3.1 L
POST FEV1/FVC RATIO: 81 %
POST FEV6/FVC RATIO: 100 %
Pre FEV1/FVC ratio: 80 %
Pre FEV6/FVC Ratio: 100 %
RV % pred: 104 %
RV: 1.83 L
TLC % PRED: 93 %
TLC: 4.87 L

## 2015-04-05 MED ORDER — ALBUTEROL SULFATE (2.5 MG/3ML) 0.083% IN NEBU
2.5000 mg | INHALATION_SOLUTION | Freq: Once | RESPIRATORY_TRACT | Status: AC
  Administered 2015-04-05: 2.5 mg via RESPIRATORY_TRACT

## 2015-04-10 ENCOUNTER — Telehealth: Payer: Self-pay | Admitting: Pulmonary Disease

## 2015-04-10 ENCOUNTER — Other Ambulatory Visit: Payer: Self-pay | Admitting: *Deleted

## 2015-04-10 MED ORDER — RANITIDINE HCL 150 MG PO TABS: 150.0000 mg | ORAL_TABLET | Freq: Every day | ORAL | Status: DC

## 2015-04-10 NOTE — Telephone Encounter (Signed)
Spoke with the patient regarding the results of her methacholine challenge test and pulmonary function testing both of which were normal without evidence of asthma. She did have the suggestion of reflux on her barium swallow. I encouraged her to continue using Zantac. She is unsure if she has any refills on this medication and I will have my nurse contact her pharmacy to ensure that no further paperwork needs to be addressed with this. The patient does feel as though her chest tightness and cough are improving on her current regimen. I requested that she contact my office if she have any further questions or concerns and we'll see her at her follow-up visit.

## 2015-05-07 ENCOUNTER — Ambulatory Visit: Payer: BLUE CROSS/BLUE SHIELD | Admitting: Pulmonary Disease

## 2015-05-16 ENCOUNTER — Ambulatory Visit (INDEPENDENT_AMBULATORY_CARE_PROVIDER_SITE_OTHER): Payer: BLUE CROSS/BLUE SHIELD | Admitting: Pulmonary Disease

## 2015-05-16 ENCOUNTER — Encounter: Payer: Self-pay | Admitting: Pulmonary Disease

## 2015-05-16 VITALS — BP 122/68 | HR 63 | Ht 67.0 in | Wt 164.8 lb

## 2015-05-16 DIAGNOSIS — R05 Cough: Secondary | ICD-10-CM

## 2015-05-16 DIAGNOSIS — K219 Gastro-esophageal reflux disease without esophagitis: Secondary | ICD-10-CM

## 2015-05-16 DIAGNOSIS — R0600 Dyspnea, unspecified: Secondary | ICD-10-CM | POA: Diagnosis not present

## 2015-05-16 DIAGNOSIS — R059 Cough, unspecified: Secondary | ICD-10-CM

## 2015-05-16 MED ORDER — SUCRALFATE 1 GM/10ML PO SUSP: 1.0000 g | Freq: Three times a day (TID) | ORAL | Status: DC

## 2015-05-16 NOTE — Progress Notes (Signed)
Subjective:    Patient ID: Joan LyonStephanie U Leon, female    DOB: 10-20-1970, 44 y.o.   MRN: [id number]  C.C.:  Follow-up for Cough, Dyspnea, GERD, & Allergic Rhinitis.  HPI Cough: Patient switched from Qvar to Symbicort at last appointment. She reports her cough is less frequent but has not totally resolved. She reports she only only awoken once with coughing. She reports she is compliant with Symbicort. She reports she rarely uses her Albuterol inhaler but feels it helps. She reports that for the couple of days that she missed her Zantac she had significantly more coughing.   Dyspnea: Methacholine challenge test negative. Patient switched from Qvar to Symbicort at last appointment. Full pulmonary function testing normal. She feels a "tightness" in her "throat" that seems to occur sporadically and doesn't seem to be precipitated by anything particular. She reports she has been exercising on a daily basis with minimal improvement in dyspnea.   GERD: Zantac was restarted at last appointment. Brand swallow showed mild esophagitis with full thickening. She reports she has noticed reflux with a variety of foods. No morning brash water taste. She notes reflux most days of the week and reports that it seems to be worse off the medication.   Allergic Rhinitis:  Serum IgE normal. Follows with Dr.  CallasSharma. She denies any sinus drainage. She denies any sinus pressure or congestion. She hasn't been using Singulair.   Review of Systems She reports her voice is changes. She reports her voice feels like she's straining to talk. No fever, chills, or sweats.   No Known Allergies  Current Outpatient Prescriptions on File Prior to Visit  Medication Sig Dispense Refill  . albuterol (PROVENTIL HFA;VENTOLIN HFA) 108 (90 BASE) MCG/ACT inhaler Inhale 2 puffs into the lungs every 4 (four) hours as needed for wheezing or shortness of breath (cough, shortness of breath or wheezing.). 1 Inhaler 1  . budesonide-formoterol  (SYMBICORT) 80-4.5 MCG/ACT inhaler Inhale 2 puffs into the lungs 2 (two) times daily. 1 Inhaler 3  . fluticasone (FLONASE) 50 MCG/ACT nasal spray Place 2 sprays into both nostrils as needed.     . loratadine (CLARITIN) 10 MG tablet Take 10 mg by mouth daily.    . montelukast (SINGULAIR) 10 MG tablet Take 10 mg by mouth at bedtime.    . ranitidine (ZANTAC) 150 MG tablet Take 1 tablet (150 mg total) by mouth at bedtime. 30 tablet 3  . Spacer/Aero-Holding Chambers (E-Z SPACER) inhaler Use as instructed 1 each 2  . omeprazole (PRILOSEC) 40 MG capsule Take 1 capsule (40 mg total) by mouth 2 (two) times daily. (Patient not taking: Reported on 05/16/2015) 60 capsule 3   No current facility-administered medications on file prior to visit.   Past Medical History  Diagnosis Date  . Allergy   . GERD (gastroesophageal reflux disease)    Past Surgical History  Procedure Laterality Date  . Cesarean section    . Wrist surgery Left 1996  . Sinusotomy  1998  . Wisdom tooth extraction     Family History  Problem Relation Age of Onset  . Cancer Mother [phone]    breast  . Scoliosis Mother   . Osteoporosis Mother   . Sjogren's syndrome Mother   . Rheum arthritis Mother   . Cancer Father [id number]    prostate  . Diabetes Father   . Scoliosis Brother   . ADD / ADHD Son   . Mental illness Brother   . Scoliosis Brother   . Asthma  Brother   . Asthma Maternal Aunt   . Asthma Maternal Grandmother   . Rheum arthritis Maternal Grandmother    Social History   Social History  . Marital Status: Single    Spouse Name: engaged  . Number of Children: 1  . Years of Education: law degree   Occupational History  . attorney    Social History Main Topics  . Smoking status: Never Smoker   . Smokeless tobacco: Never Used  . Alcohol Use: 0.0 oz/week    0 Standard drinks or equivalent per week     Comment: occasionally on weekends  . Drug Use: No  . Sexual Activity: Not Asked   Other Topics Concern  . None    Social History Narrative   Originally from [place], [place]. She has lived in [place] & [place]. She moved to [facility] from [phone] and then moved back in 1997. 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 She has a rabbit in her son's room. No bird exposure. She denies any mold or mildew in her house or prior apartment. She reports that while living with her dad for 5 years until 2012 he had a leaky roof with mold also in her room. No hot tub exposure. Enjoys hiking & bicycling. She also does swimming.    Previously has traveled to [place] in April 2016.       Objective:   Physical Exam  Blood pressure 122/68, pulse 63, height  (1.702 m), weight 164 lb 12.8 oz (74.753 kg), SpO2 98 %.  General:  Caucasian female. No distress. Appears comfortable sitting in chair. Integument:  Warm & dry. No rash on exposed skin.  HEENT:  Moist mucus membranes. No nasal turbinate swelling. No oral ulcers. Cardiovascular:  Regular rate & rhythm. No edema.  Pulmonary:  Clear bilaterally to auscultation. No work of breathing on room air. Musculoskeletal:  No joint deformity or effusion appreciated. Normal bulk and tone. No appreciated MCP or PIP synovial thickening.  PFT 04/05/15: FVC 3.10 L (82%) FEV1 2.48 L (82%) FEV1/FVC 0.80 FEF 25-75 2.34 L (78%) no bronchodilator response TLC 4.87 L (93%) RV 104% ERV 56% DLCO uncorrected 83% 02/28/15: FVC 2.73 L (73%) FEV1 2.21 L (74%) FEV1/FVC 0.81 FEF 25-75 2.24 L (74%)  METHACHOLINE CHALLENGE TEST (04/04/15): Negative. Normal spirometry.  IMAGING BARIUM SWALLOW (04/05/15): Normal esophageal motility. Full thickening in the lower esophagus consistent with mild esophagitis. No ulceration or fissure noted. No masses noted. Reflux was not encountered. No hiatal hernia.  CXR PA/LAT 02/28/15 (previously reviewed by me): Mild hyperinflation with deep sulci bilaterally. No pleural effusion or thickening appreciated. No parenchymal nodule or opacity appreciated. Heart  normal in size. Mediastinum normal in contour. Chest x-rays unchanged when compared with prior chest x-ray from January 2016.   LABS 03/29/15  CBC: 7.9/13.5/40.3/303 Eos:  0.1 IgE: 98  08/16/14 CBC: 7.7/13.2/40.4/355  08/29/14 Rapid strep: Negative/normal upper respiratory flora    Assessment & Plan:  44 year old Caucasian female with ongoing cough and some clinical improvement on her current regimen. Given the worsening of symptoms with stopping Zantac for only a couple of days I remain highly suspicious that reflux is contributing to her cough. Her methacholine challenge test was negative for any abnormal bronchial hyperactivity & her pulmonary function testing was completely normal making asthma and probable. Given the changes in her voice as described above I do question whether or not she may have some vocal cord dysfunction, possibly secondary  to ongoing reflux. She has resumed some exercise regimen despite ongoing cough which has improved her spirits. I instructed the patient to contact my office if she had any new concerns or questions before her follow-up appointment.  1. Cough: Increasing treatment of underlying reflux. Referring patient to [facility] for evaluation by Dr. Delford Field. 2. Dyspnea: Suspect this is multifactorial. Given which changes I question vocal cord dysfunction. Patient attempting abstinence from Symbicort to see if there is any symptomatic benefit to continue to inhaler therapy. 3. GERD: Continuing Zantac. Starting Carafate 1 g before meals as well as at bedtime. Consider referral to GI depending upon results from ENT evaluation & direct laryngoscopy. 4. Allergic rhinitis: Reviewed serum test results with the patient. I am doubtful this represents allergic rhinitis. Recommended discontinuing Singulair & Flonase. 5. Follow-up: Patient to return to clinic in 1-2 months or sooner if needed.

## 2015-05-16 NOTE — Patient Instructions (Signed)
1. Continue taking Zantac as prescribed. We are adding Carafate liquid to your regimen to help suppress your reflux. 2. I'm referring you to ENT at [facility] for evaluation at their [facility]. 3. Try stopping your Singulair (montelukast) & Flonase (nasal spray). If you notice no change in your cough frequency after 1 week you can try stopping your Symbicort inhaler as well. 4.  I will see you back in 1-2 months. Please call my office if you have any further questions or concerns.

## 2015-07-10 ENCOUNTER — Encounter: Payer: Self-pay | Admitting: Pulmonary Disease

## 2015-07-10 ENCOUNTER — Ambulatory Visit (INDEPENDENT_AMBULATORY_CARE_PROVIDER_SITE_OTHER): Payer: BLUE CROSS/BLUE SHIELD | Admitting: Pulmonary Disease

## 2015-07-10 VITALS — BP 90/62 | HR 54 | Ht 66.0 in | Wt 167.0 lb

## 2015-07-10 DIAGNOSIS — J383 Other diseases of vocal cords: Secondary | ICD-10-CM

## 2015-07-10 DIAGNOSIS — K219 Gastro-esophageal reflux disease without esophagitis: Secondary | ICD-10-CM | POA: Diagnosis not present

## 2015-07-10 NOTE — Progress Notes (Signed)
Subjective:    Patient ID: Ethelle LyonStephanie U Pekar, female    DOB: Aug 07, 1970, 44 y.o.   MRN: [id number]  C.C.:  Follow-up for Cough, Dyspnea, GERD, & Allergic Rhinitis.  HPI Cough: Suspected secondary to reflux. Referred to Dr. Delford FieldWright at [facility] at last appointment. Reportedly she has improper movement of her vocal chords presumably after a viral infection. She was started on Neurontin as well as Xyzal with follow-up for possible surgical intervention. She reports her cough has significantly improved. Exercising seems to make her cough worse.  Dyspnea: Methacholine challenge test negative. Trialed off inhaler medications at last appointment. Hasn't needed to use them at all since going on Gabapentin.   GERD: Barium swallow previously showed mild esophagitis with full thickening. Carafate started in addition to Zantac at last appointment. Reports she had significant reflux when she was off Zantac briefly. She reports she stopped Carafate because it seemed to make her reflux worse.   Allergic Rhinitis:  Serum IgE normal. Follows with Dr.  CallasSharma. Recommended discontinuing Flonase & Singulair at last appointment. She has continued to use Singulair & Loratadine. No post-nasal drainage.  Review of Systems No fever, chills, or sweats. No chest pain or pressure. No chest tightness.  Allergies  Allergen Reactions  . Hydrocodone-Acetaminophen Nausea And Vomiting    Current Outpatient Prescriptions on File Prior to Visit  Medication Sig Dispense Refill  . albuterol (PROVENTIL HFA;VENTOLIN HFA) 108 (90 BASE) MCG/ACT inhaler Inhale 2 puffs into the lungs every 4 (four) hours as needed for wheezing or shortness of breath (cough, shortness of breath or wheezing.). 1 Inhaler 1  . loratadine (CLARITIN) 10 MG tablet Take 10 mg by mouth daily.    . montelukast (SINGULAIR) 10 MG tablet Take 10 mg by mouth at bedtime.    . ranitidine (ZANTAC) 150 MG tablet Take 1 tablet (150 mg total) by mouth at bedtime. 30  tablet 3  . Spacer/Aero-Holding Chambers (E-Z SPACER) inhaler Use as instructed 1 each 2  . sucralfate (CARAFATE) 1 GM/10ML suspension Take 10 mLs (1 g total) by mouth 4 (four) times daily -  with meals and at bedtime. 420 mL 3   No current facility-administered medications on file prior to visit.   Past Medical History  Diagnosis Date  . Allergy   . GERD (gastroesophageal reflux disease)    Past Surgical History  Procedure Laterality Date  . Cesarean section    . Wrist surgery Left 1996  . Sinusotomy  1998  . Wisdom tooth extraction     Family History  Problem Relation Age of Onset  . Cancer Mother [id number]    breast  . Scoliosis Mother   . Osteoporosis Mother   . Sjogren's syndrome Mother   . Rheum arthritis Mother   . Cancer Father [id number]    prostate  . Diabetes Father   . Scoliosis Brother   . ADD / ADHD Son   . Mental illness Brother   . Scoliosis Brother   . Asthma Brother   . Asthma Maternal Aunt   . Asthma Maternal Grandmother   . Rheum arthritis Maternal Grandmother    Social History   Social History  . Marital Status: Married    Spouse Name: engaged  . Number of Children: 1  . Years of Education: law degree   Occupational History  . attorney    Social History Main Topics  . Smoking status: Never Smoker   . Smokeless tobacco: Never Used  . Alcohol Use: 0.0 oz/week  0 Standard drinks or equivalent per week     Comment: occasionally on weekends  . Drug Use: No  . Sexual Activity: Not Asked   Other Topics Concern  . None   Social History Narrative   Originally from [place], [place]. She has lived in [place] & [place]. She moved to [facility] from [id number] and then moved back in 1997. 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 She has a rabbit in her son's room. No bird exposure. She denies any mold or mildew in her house or prior apartment. She reports that while living with her dad for 5 years until 2012 he had a leaky roof with mold also in her  room. No hot tub exposure. Enjoys hiking & bicycling. She also does swimming.    Previously has traveled to [place] in April 2016.       Objective:   Physical Exam  BP 90/62 mmHg  Pulse 54  Ht  (1.676 m)  Wt 167 lb (75.751 kg)  BMI 26.97 kg/m2  SpO2 98% General:  Caucasian female. Awake. Alert. Standing due to back discomfort. Integument:  Warm & dry. No rash or bruising on exposed skin.  HEENT:  Moist mucus membranes. No nasal turbinate swelling. No oral ulcers. Cardiovascular:  Regular rate. Normal S1 & S2. No edema.  Pulmonary:  Clear to auscultation bilaterally. Normal work of breathing on room air. Musculoskeletal:  No joint deformity or effusion appreciated. Normal bulk and tone. No appreciated MCP or PIP synovial thickening.  PFT 04/05/15: FVC 3.10 L (82%) FEV1 2.48 L (82%) FEV1/FVC 0.80 FEF 25-75 2.34 L (78%) no bronchodilator response TLC 4.87 L (93%) RV 104% ERV 56% DLCO uncorrected 83% 02/28/15: FVC 2.73 L (73%) FEV1 2.21 L (74%) FEV1/FVC 0.81 FEF 25-75 2.24 L (74%)  METHACHOLINE CHALLENGE TEST (04/04/15): Negative. Normal spirometry.  IMAGING BARIUM SWALLOW (04/05/15): Normal esophageal motility. Full thickening in the lower esophagus consistent with mild esophagitis. No ulceration or fissure noted. No masses noted. Reflux was not encountered. No hiatal hernia.  CXR PA/LAT 02/28/15 (previously reviewed by me): Mild hyperinflation with deep sulci bilaterally. No pleural effusion or thickening appreciated. No parenchymal nodule or opacity appreciated. Heart normal in size. Mediastinum normal in contour. Chest x-rays unchanged when compared with prior chest x-ray from January 2016.   LABS 03/29/15  CBC: 7.9/13.5/40.3/303 Eos:  0.1 IgE: 98  08/16/14 CBC: 7.7/13.2/40.4/355  08/29/14 Rapid strep: Negative/normal upper respiratory flora    Assessment & Plan:  44 year old Caucasian female with ongoing cough and significant clinical improvement with suppression of underlying  reflux as well as initiation of medication therapy for nerve damage resulting in vocal cord dysfunction. Patient does have follow-up with ENT (Dr. Delford Field) at [facility]. Attempting to wean her off of Zantac and eliminating medications for allergies. Instructed patient to contact my office if she had any new breathing problems. Discontinuing Singulair, loratadine, Symbicort, & Proventil at this time.  1. Vocal cord dysfunction: Has follow-up with Dr. Delford Field. Continuing on Xyzal & Neurontin. 2. GERD: Tapering Zantac to lowest dose possible to suppress underlying reflux. 3. Follow-up: Patient will return to clinic on an as-needed basis.

## 2015-07-10 NOTE — Patient Instructions (Signed)
1. You can stop taking loratadine, Symbicort, Proventil, & Singulair. 2. Weaning herself off of Zantac/ranitidine by taking one half tablet daily for 5 days then one half tablet every other day for a week then try stopping the medication completely. You may be able to take only 75 mg daily to suppress your reflux. 3. Please call my office if you have any new breathing problems or I can help you out in any way in the future.

## 2015-08-29 ENCOUNTER — Other Ambulatory Visit: Payer: Self-pay

## 2015-08-29 DIAGNOSIS — Z1231 Encounter for screening mammogram for malignant neoplasm of breast: Secondary | ICD-10-CM

## 2015-09-19 ENCOUNTER — Ambulatory Visit
Admission: RE | Admit: 2015-09-19 | Discharge: 2015-09-19 | Disposition: A | Discharge status: Home / Self Care | Payer: BLUE CROSS/BLUE SHIELD | Source: Ambulatory Visit

## 2015-09-19 DIAGNOSIS — Z1231 Encounter for screening mammogram for malignant neoplasm of breast: Secondary | ICD-10-CM

## 2017-04-06 IMAGING — CR DG CHEST 2V
2 series · 2 of 2 positions shown · non-contrast
Comparison: Chest radiograph 08/16/2014

CLINICAL DATA: Patient with fatigue.  Nonsmoker.

EXAM:
CHEST  2 VIEW

[PA]
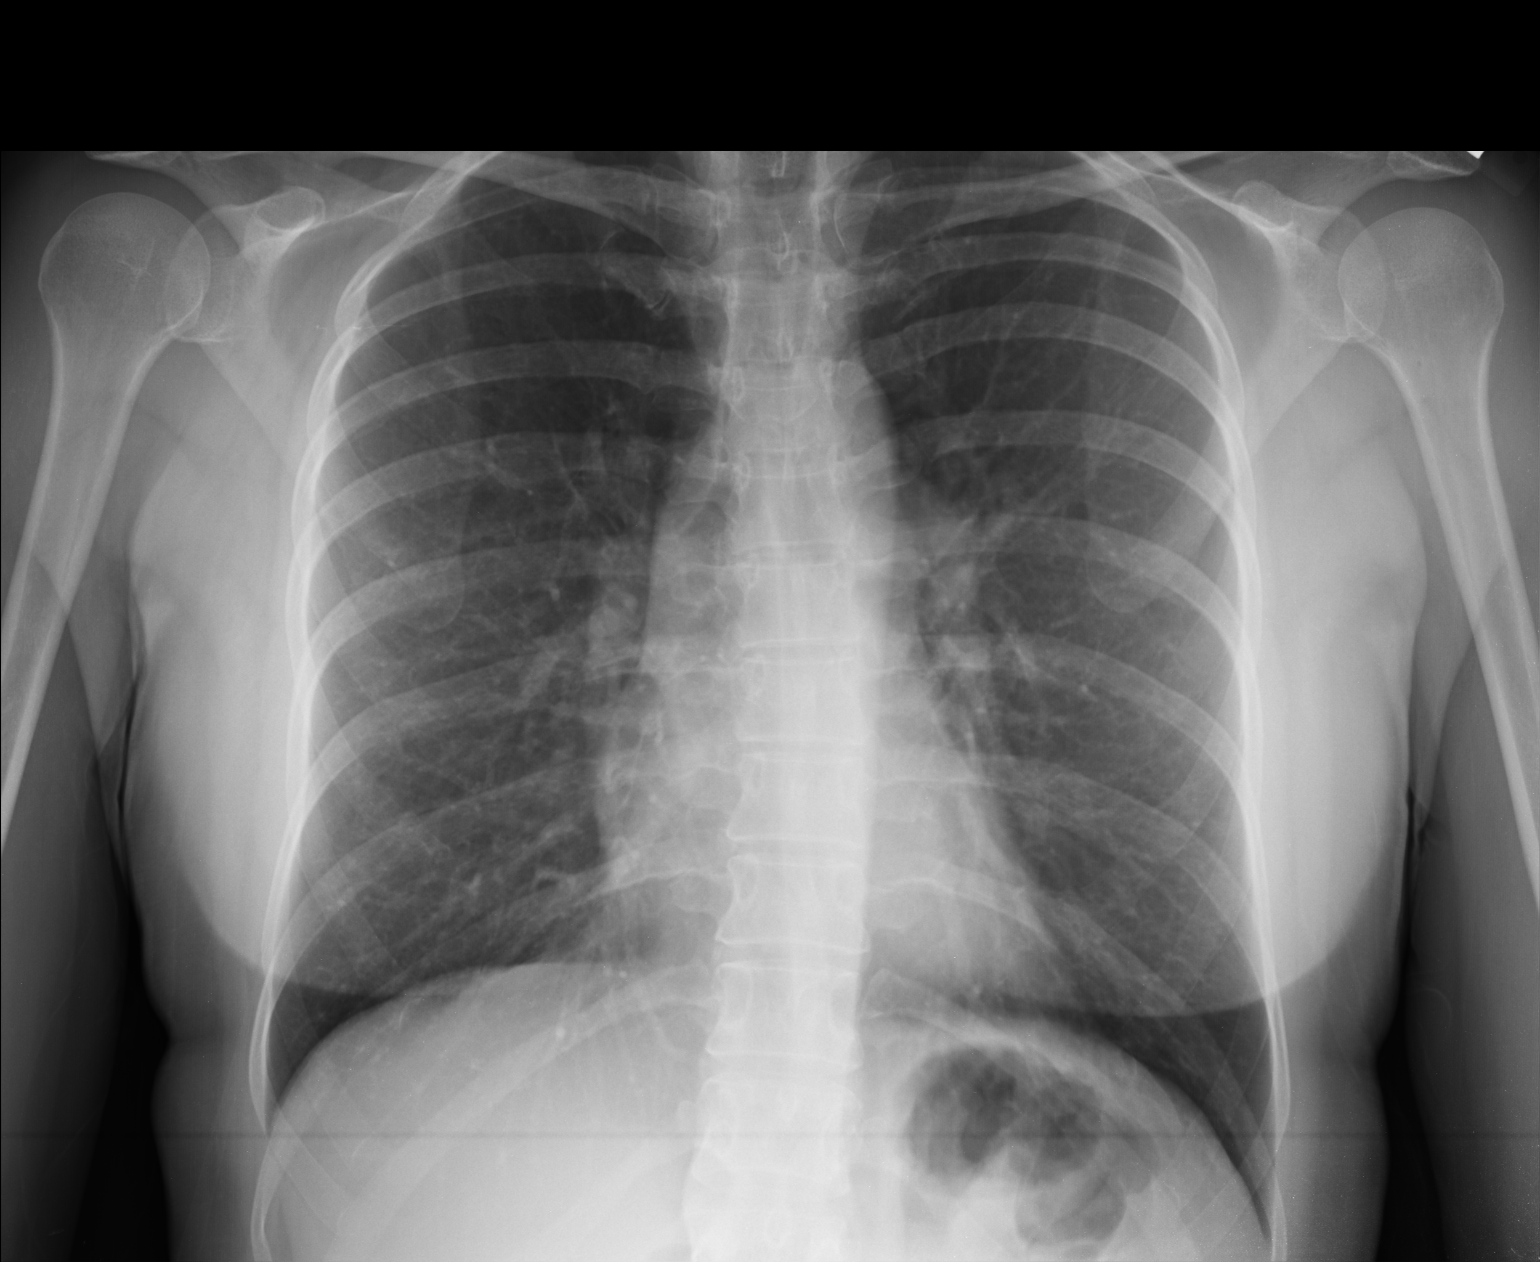

[lateral]
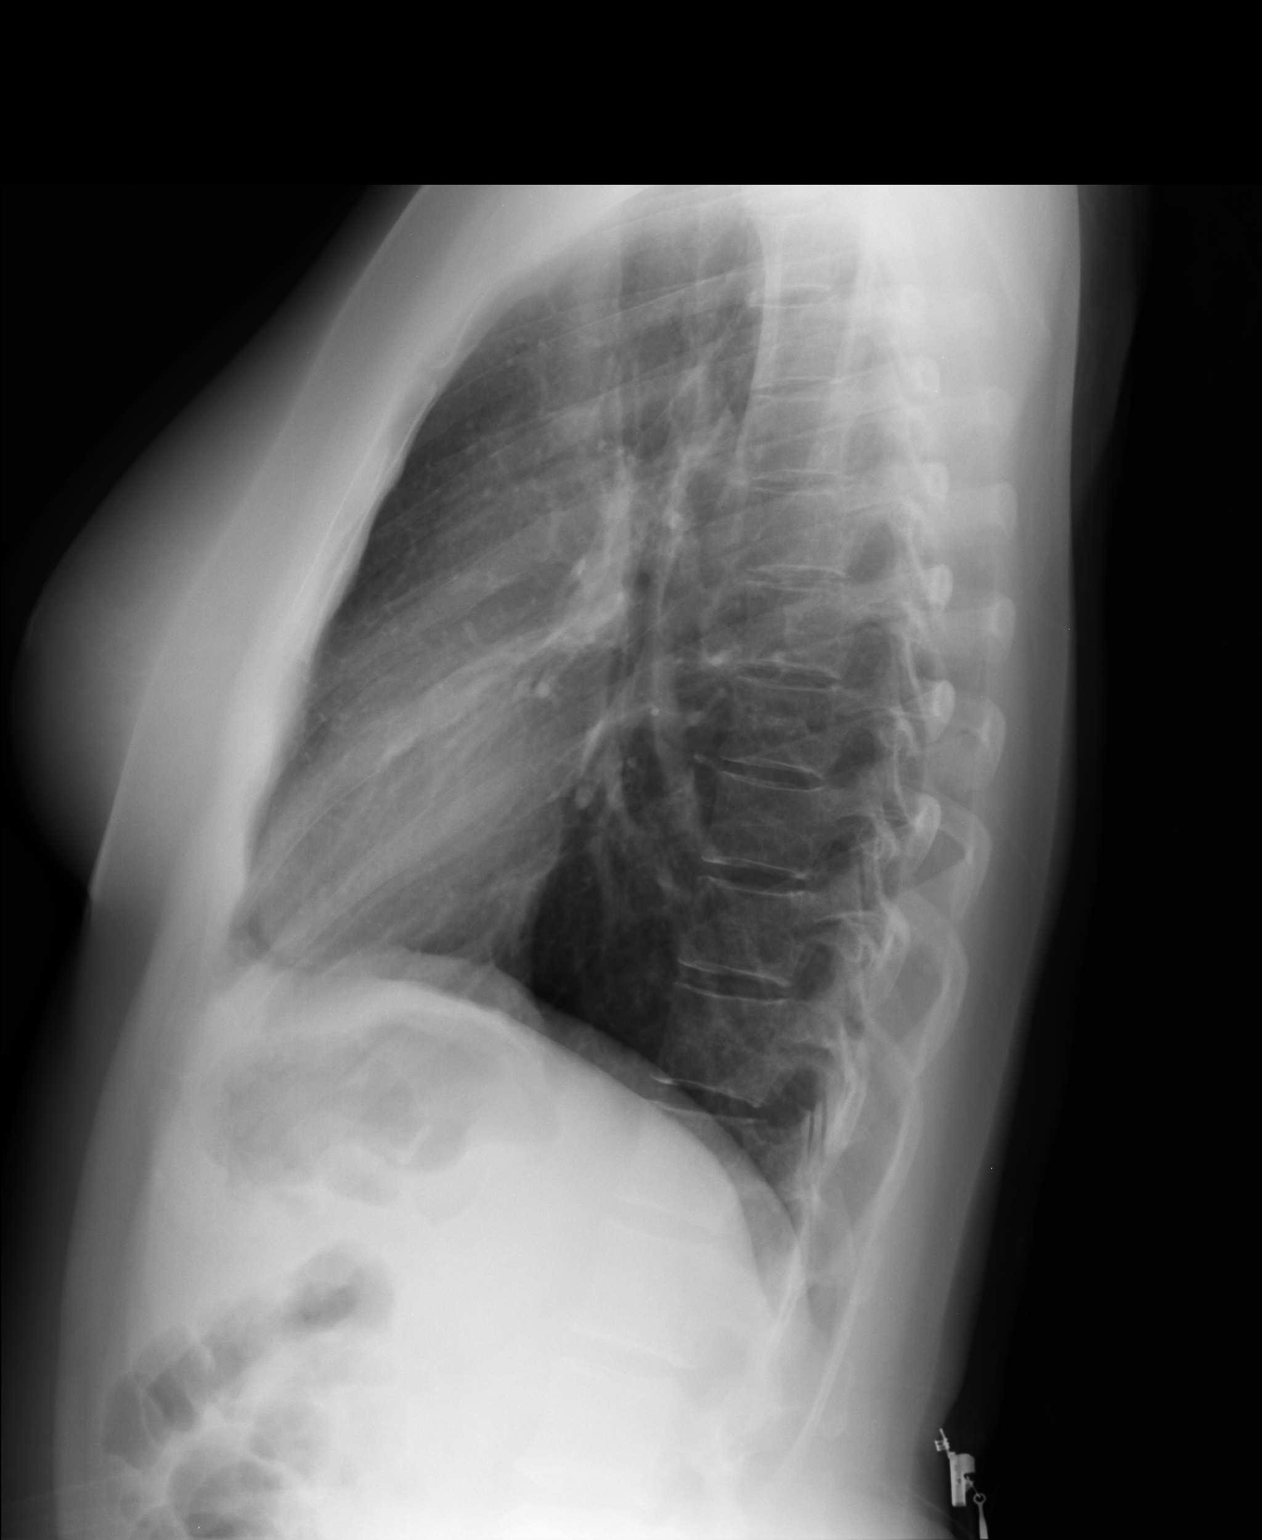

[2 of 2 positions shown; findings below may reference images not displayed]

FINDINGS: Normal cardiac and mediastinal contours. No consolidative pulmonary
opacities. No pleural effusion or pneumothorax. Regional skeleton is
unremarkable.
IMPRESSION: No acute cardiopulmonary process.

## 2017-10-29 ENCOUNTER — Emergency Department (HOSPITAL_COMMUNITY)
Admission: EM | Admit: 2017-10-29 | Discharge: 2017-10-29 | Disposition: A | Discharge status: Home / Self Care | Payer: BLUE CROSS/BLUE SHIELD | Attending: Emergency Medicine | Admitting: Emergency Medicine

## 2017-10-29 ENCOUNTER — Other Ambulatory Visit: Payer: Self-pay

## 2017-10-29 ENCOUNTER — Encounter (HOSPITAL_COMMUNITY): Payer: Self-pay | Admitting: *Deleted

## 2017-10-29 DIAGNOSIS — Z79899 Other long term (current) drug therapy: Secondary | ICD-10-CM | POA: Diagnosis not present

## 2017-10-29 DIAGNOSIS — R69 Illness, unspecified: Secondary | ICD-10-CM

## 2017-10-29 DIAGNOSIS — J111 Influenza due to unidentified influenza virus with other respiratory manifestations: Secondary | ICD-10-CM | POA: Insufficient documentation

## 2017-10-29 DIAGNOSIS — R112 Nausea with vomiting, unspecified: Secondary | ICD-10-CM | POA: Diagnosis present

## 2017-10-29 DIAGNOSIS — R55 Syncope and collapse: Secondary | ICD-10-CM | POA: Diagnosis not present

## 2017-10-29 DIAGNOSIS — R197 Diarrhea, unspecified: Secondary | ICD-10-CM

## 2017-10-29 LAB — URINALYSIS, ROUTINE W REFLEX MICROSCOPIC
Bacteria, UA: NONE SEEN
Bilirubin Urine: NEGATIVE
GLUCOSE, UA: NEGATIVE mg/dL
Ketones, ur: NEGATIVE mg/dL
Nitrite: NEGATIVE
PROTEIN: NEGATIVE mg/dL
Specific Gravity, Urine: 1.002 — ABNORMAL LOW (ref 1.005–1.030)
pH: 6 (ref 5.0–8.0)

## 2017-10-29 LAB — CBC
HCT: 42.4 % (ref 36.0–46.0)
Hemoglobin: 14 g/dL (ref 12.0–15.0)
MCH: 30.4 pg (ref 26.0–34.0)
MCHC: 33 g/dL (ref 30.0–36.0)
MCV: 92.2 fL (ref 78.0–100.0)
PLATELETS: 240 10*3/uL (ref 150–400)
RBC: 4.6 MIL/uL (ref 3.87–5.11)
RDW: 14.6 % (ref 11.5–15.5)
WBC: 4.8 10*3/uL (ref 4.0–10.5)

## 2017-10-29 LAB — COMPREHENSIVE METABOLIC PANEL
ALK PHOS: 73 U/L (ref 38–126)
ALT: 38 U/L (ref 14–54)
AST: 39 U/L (ref 15–41)
Albumin: 3.4 g/dL — ABNORMAL LOW (ref 3.5–5.0)
Anion gap: 10 (ref 5–15)
BILIRUBIN TOTAL: 0.5 mg/dL (ref 0.3–1.2)
BUN: 7 mg/dL (ref 6–20)
CALCIUM: 8.3 mg/dL — AB (ref 8.9–10.3)
CO2: 24 mmol/L (ref 22–32)
CREATININE: 0.91 mg/dL (ref 0.44–1.00)
Chloride: 99 mmol/L — ABNORMAL LOW (ref 101–111)
GFR calc non Af Amer: >60 mL/min (ref >60)
Glucose, Bld: 93 mg/dL (ref 65–99)
Potassium: 3.8 mmol/L (ref 3.5–5.1)
Sodium: 133 mmol/L — ABNORMAL LOW (ref 135–145)
TOTAL PROTEIN: 6.7 g/dL (ref 6.5–8.1)

## 2017-10-29 LAB — I-STAT BETA HCG BLOOD, ED (MC, WL, AP ONLY): I-stat hCG, quantitative: <5 m[IU]/mL (ref <5)

## 2017-10-29 LAB — LIPASE, BLOOD: Lipase: 28 U/L (ref 11–51)

## 2017-10-29 MED ORDER — SODIUM CHLORIDE 0.9 % IV BOLUS
1000.0000 mL | Freq: Once | INTRAVENOUS | Status: AC
  Administered 2017-10-29: 1000 mL via INTRAVENOUS

## 2017-10-29 MED ORDER — ONDANSETRON 4 MG PO TBDP: 4.0000 mg | ORAL_TABLET | Freq: Three times a day (TID) | ORAL | 0 refills | Status: DC | PRN

## 2017-10-29 NOTE — ED Triage Notes (Addendum)
Pt in c/o recent dx of flu x 2 days ago and took tamiflu today pt c/o feeling lightheaded and n/v/d onset today, pt reports x 2 vomiting and x 3 loose stools over the last 24 hrs, pt A&O x4

## 2017-10-29 NOTE — ED Notes (Signed)
PT states understanding of care given, follow up care, and medication prescribed. PT ambulated from ED to car with a steady gait. 

## 2017-10-29 NOTE — ED Provider Notes (Addendum)
[facility] Provider Note   CSN: [id number] Arrival date & time: 10/29/17  1035     History   Chief Complaint No chief complaint on file.   HPI Joan Leon is a 47 y.o. female past medical history of GERD, presenting to the ED with nausea, vomiting, and diarrhea that began today.  Patient states she has been treated for the flu, and has been taking Tamiflu for the past 2 days.  She states this morning she got up from bed and walked to the kitchen. She states she immediately began feeling a cold sweat and felt her vision darkening and as if she were to pass out.  She states she did not have a syncopal episode.  Her husband witnessed the event, and states she did not have any facial droop or obvious deficits.  No falls.  She states following this episode she began having nausea, vomiting, and diarrhea throughout the morning.  She states she had not anything to eat or drink today prior to the event.  Denies headache, vision changes, chest pain, abdominal pain, or any other complaints.  The history is provided by the patient and the spouse.    Past Medical History:  Diagnosis Date  . Allergy   . GERD (gastroesophageal reflux disease)     Patient Active Problem List   Diagnosis Date Noted  . Vocal cord dysfunction 07/10/2015  . Dyspnea 05/16/2015  . Cough 03/29/2015  . GERD (gastroesophageal reflux disease) 03/29/2015    Past Surgical History:  Procedure Laterality Date  . CESAREAN SECTION    . SINUSOTOMY  1998  . WISDOM TOOTH EXTRACTION    . WRIST SURGERY Left 1996     OB History   None      Home Medications    Prior to Admission medications   Medication Sig Start Date End Date Taking? Authorizing Provider  gabapentin (NEURONTIN) 100 MG capsule Take 100 mg by mouth at bedtime.    [provider]  levocetirizine (XYZAL) 5 MG tablet Take 5 mg by mouth every evening.    [provider]  ondansetron (ZOFRAN  ODT) 4 MG disintegrating tablet Take 1 tablet (4 mg total) by mouth every 8 (eight) hours as needed for nausea or vomiting. 10/29/17   Robinson, [place] N, PA-C  ranitidine (ZANTAC) 150 MG tablet Take 1 tablet (150 mg total) by mouth at bedtime. 04/10/15   Roslynn AmbleNestor, Jennings E, MD  Spacer/Aero-Holding Chambers (E-Z SPACER) inhaler Use as instructed 02/28/15   Brewington, Arta Bruceishira R, PA-C    Family History Family History  Problem Relation Age of Onset  . Cancer Mother [phone]       breast  . Scoliosis Mother   . Osteoporosis Mother   . Sjogren's syndrome Mother   . Rheum arthritis Mother   . Cancer Father [id number]       prostate  . Diabetes Father   . Scoliosis Brother   . ADD / ADHD Son   . Mental illness Brother   . Scoliosis Brother   . Asthma Brother   . Asthma Maternal Aunt   . Asthma Maternal Grandmother   . Rheum arthritis Maternal Grandmother     Social History Social History   Tobacco Use  . Smoking status: Never Smoker  . Smokeless tobacco: Never Used  Substance Use Topics  . Alcohol use: Yes    Alcohol/week: 0.0 oz    Comment: occasionally on weekends  . Drug use: No  Allergies   Hydrocodone-acetaminophen   Review of Systems Review of Systems  Constitutional: Negative for fever.  HENT: Positive for rhinorrhea and sore throat.   Eyes: Negative for visual disturbance.  Respiratory: Positive for cough. Negative for shortness of breath.   Gastrointestinal: Positive for diarrhea, nausea and vomiting. Negative for anal bleeding and blood in stool.  Genitourinary: Negative for dysuria and frequency.  Neurological: Negative for headaches.  All other systems reviewed and are negative.    Physical Exam Updated Vital Signs BP 114/69   Pulse (!) 56   Temp 98.1 F (36.8 C) (Oral)   Resp 16   LMP 10/26/2017 (Approximate)   SpO2 100%   Physical Exam  Constitutional: She is oriented to person, place, and time. She appears well-developed and well-nourished. No distress.    HENT:  Head: Normocephalic and atraumatic.  Mouth/Throat: Oropharynx is clear and moist.  Eyes: Pupils are equal, round, and reactive to light. Conjunctivae and EOM are normal.  Neck: Normal range of motion. Neck supple.  Cardiovascular: Normal rate, regular rhythm and intact distal pulses.  Pulmonary/Chest: Effort normal and breath sounds normal. No respiratory distress.  Abdominal: Soft. Bowel sounds are normal. She exhibits no distension. There is no tenderness. There is no rebound and no guarding.  Neurological: She is alert and oriented to person, place, and time.  Mental Status:  Alert, oriented, thought content appropriate, able to give a coherent history. Speech fluent without evidence of aphasia. Able to follow 2 step commands without difficulty.  Cranial Nerves:  II:  Peripheral visual fields grossly normal, pupils equal, round, reactive to light III,IV, VI: ptosis not present, extra-ocular motions intact bilaterally  V,VII: smile symmetric, facial light touch sensation equal VIII: hearing grossly normal to voice  X: uvula elevates symmetrically  XI: bilateral shoulder shrug symmetric and strong XII: midline tongue extension without fassiculations Motor:  Normal tone. 5/5 in upper and lower extremities bilaterally including strong and equal grip strength and dorsiflexion/plantar flexion Sensory: Pinprick and light touch normal in all extremities.  Deep Tendon Reflexes: 2+ and symmetric in the biceps and patella Cerebellar: normal finger-to-nose with bilateral upper extremities Gait: normal gait and balance CV: distal pulses palpable throughout    Skin: Skin is warm.  Psychiatric: She has a normal mood and affect. Her behavior is normal.  Nursing note and vitals reviewed.    ED Treatments / Results  Labs (all labs ordered are listed, but only abnormal results are displayed) Labs Reviewed  COMPREHENSIVE METABOLIC PANEL - Abnormal; Notable for the following components:       Result Value   Sodium 133 (*)    Chloride 99 (*)    Calcium 8.3 (*)    Albumin 3.4 (*)    All other components within normal limits  URINALYSIS, ROUTINE W REFLEX MICROSCOPIC - Abnormal; Notable for the following components:   Color, Urine STRAW (*)    APPearance HAZY (*)    Specific Gravity, Urine 1.002 (*)    Hgb urine dipstick MODERATE (*)    Leukocytes, UA TRACE (*)    Squamous Epithelial / LPF 6-30 (*)    All other components within normal limits  LIPASE, BLOOD  CBC  I-STAT BETA HCG BLOOD, ED ([facility], [facility], [facility] ONLY)    EKG None  Radiology No results found.  Procedures Procedures (including critical care time)  Medications Ordered in ED Medications  sodium chloride 0.9 % bolus 1,000 mL (1,000 mLs Intravenous New Bag/Given 10/29/17 1806)     Initial  Impression / Assessment and Plan / ED Course  I have reviewed the triage vital signs and the nursing notes.  Pertinent labs & imaging results that were available during my care of the patient were reviewed by me and considered in my medical decision making (see chart for details).    Pt with recent diagnosis of influenza-like illness, presenting to ED with N/V/D and near-syncopal episode. Pt had just gotten out of bed when she had symptoms of lightheadedness and feeling as though she was going to pass out. She states symptoms resolved on their own, without residual sx. Denies nausea in the ED, and no episodes of emesis. Abd is soft and nontender. Lungs CTAB. Afebrile. Labs without leukocytosis, no significant electrolyte abnormalities. EKG in NSR. Suspect vaso-vagal episode as cause of pre-syncope. Pt slightly bradycardic; per chart review pt with multiple documented visits with HR in low 60s. Pt states her heart rate is low intermittently, this is not new. Doubt this is cause of sx today. IVF given in ED, pt with improvement. Discussed importance of oral rehydration and sx treatment. PCP followup recommended. Safe for  discharge.  Discussed results, findings, treatment and follow up. Patient advised of return precautions. Patient verbalized understanding and agreed with plan.   Final Clinical Impressions(s) / ED Diagnoses   Final diagnoses:  Nausea vomiting and diarrhea  Influenza-like illness    ED Discharge Orders        Ordered    ondansetron (ZOFRAN ODT) 4 MG disintegrating tablet  Every 8 hours PRN     10/29/17 2001       Robinson, [place] N, PA-C 10/29/17 2001    Robinson, [place] N, [place] 10/29/17 Bridgett Larsson, MD 11/05/17 1517

## 2017-10-29 NOTE — Discharge Instructions (Addendum)
Please read instructions below. You can take tylenol or ibuprofen as needed for sore throat or body aches. Drink plenty of water. Follow up with your primary care provider. You can take zofran every 8 hours as needed for nausea. Return to the ER for difficulty swallowing liquids, difficulty breathing, or new or worsening symptoms.

## 2019-11-01 ENCOUNTER — Other Ambulatory Visit: Payer: Self-pay | Admitting: Gastroenterology

## 2019-11-01 DIAGNOSIS — R1310 Dysphagia, unspecified: Secondary | ICD-10-CM

## 2019-11-21 ENCOUNTER — Ambulatory Visit
Admission: RE | Admit: 2019-11-21 | Discharge: 2019-11-21 | Disposition: A | Discharge status: Home / Self Care | Payer: BLUE CROSS/BLUE SHIELD | Source: Ambulatory Visit | Attending: Gastroenterology | Admitting: Gastroenterology

## 2019-11-21 DIAGNOSIS — R1310 Dysphagia, unspecified: Secondary | ICD-10-CM

## 2019-12-04 ENCOUNTER — Other Ambulatory Visit: Payer: Self-pay

## 2019-12-04 ENCOUNTER — Ambulatory Visit: Payer: BC Managed Care – PPO | Admitting: Pulmonary Disease

## 2019-12-04 ENCOUNTER — Encounter: Payer: Self-pay | Admitting: Pulmonary Disease

## 2019-12-04 VITALS — BP 118/62 | HR 77 | Temp 98.0°F | Ht 66.0 in | Wt 182.0 lb

## 2019-12-04 DIAGNOSIS — J9801 Acute bronchospasm: Secondary | ICD-10-CM

## 2019-12-04 NOTE — Progress Notes (Signed)
Joan Leon    [id number]    06-12-1971  Primary Care Physician:Eksir, Nira Retort, MD  Referring Physician: Gwenlyn Found, MD [address] [place],  [place] [address]  Chief complaint:   Exercise induce asthma  Allergic Rhinitis GERD  HPI: Joan Leon is a 49 year old person with a PMhx of GERD and vocal chord atrophy who presents for evaluation of exercised induced bronchospasm. Patient reports shortness of breath with running up hill for many years. She does not get these symptoms when running on indoor elliptical . Previously thought to be related to neurogenic vocal chords and GERD , but these conditions have improved. She had exposure to mold in 2012 when living in a home with a roof leak. She has tried albuterol before exercise but this has not improved her symptoms. Denies any history of chest pains, palpitations, or unexplained joint pains.   Pets: no animals  Occupation: Clinical research associate Exposures: Mold exposure, no hotubs, no bird exposure Smoking history: Never Smoker   Travel history: [place] in 2016  Relevant family history: RA in Maternal grandmother, and mother . Sjogren's in mother   Outpatient Encounter Medications as of 12/04/2019  Medication Sig  . [DISCONTINUED] gabapentin (NEURONTIN) 100 MG capsule Take 100 mg by mouth at bedtime.  . [DISCONTINUED] levocetirizine (XYZAL) 5 MG tablet Take 5 mg by mouth every evening.  . [DISCONTINUED] ondansetron (ZOFRAN ODT) 4 MG disintegrating tablet Take 1 tablet (4 mg total) by mouth every 8 (eight) hours as needed for nausea or vomiting.  . [DISCONTINUED] ranitidine (ZANTAC) 150 MG tablet Take 1 tablet (150 mg total) by mouth at bedtime.  . [DISCONTINUED] Spacer/Aero-Holding Chambers (E-Z SPACER) inhaler Use as instructed   No facility-administered encounter medications on file as of 12/04/2019.    Allergies as of 12/04/2019 - Review Complete 12/04/2019  Allergen Reaction Noted  .  Hydrocodone-acetaminophen Nausea And Vomiting 07/10/2015    Past Medical History:  Diagnosis Date  . Allergy   . GERD (gastroesophageal reflux disease)     Past Surgical History:  Procedure Laterality Date  . CESAREAN SECTION    . SINUSOTOMY  1998  . WISDOM TOOTH EXTRACTION    . WRIST SURGERY Left 1996    Family History  Problem Relation Age of Onset  . Cancer Mother 46       breast  . Scoliosis Mother   . Osteoporosis Mother   . Sjogren's syndrome Mother   . Rheum arthritis Mother   . Cancer Father 3       prostate  . Diabetes Father   . Scoliosis Brother   . ADD / ADHD Son   . Mental illness Brother   . Scoliosis Brother   . Asthma Brother   . Asthma Maternal Aunt   . Asthma Maternal Grandmother   . Rheum arthritis Maternal Grandmother     Social History   Socioeconomic History  . Marital status: Married    Spouse name: engaged  . Number of children: 1  . Years of education: law degree  . Highest education level: Not on file  Occupational History  . Occupation: attorney  Tobacco Use  . Smoking status: Never Smoker  . Smokeless tobacco: Never Used  Substance and Sexual Activity  . Alcohol use: Yes    Alcohol/week: 0.0 standard drinks    Comment: occasionally on weekends  . Drug use: No  . Sexual activity: Not on file  Other Topics  Concern  . Not on file  Social History Narrative   Originally from [place], [place]. She has lived in [place] & [place]. She moved to [facility] from [phone] and then moved back in 1997. 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 She has a rabbit in her son's room. No bird exposure. She denies any mold or mildew in her house or prior apartment. She reports that while living with her dad for 5 years until 2012 he had a leaky roof with mold also in her room. No hot tub exposure. Enjoys hiking & bicycling. She also does swimming.    Previously has traveled to [place] in April 2016.    Social Determinants of Health    Financial Resource Strain:   . Difficulty of Paying Living Expenses:   Food Insecurity:   . Worried About Programme researcher, broadcasting/film/video in the Last Year:   . Barista in the Last Year:   Transportation Needs:   . Freight forwarder (Medical):   Marland Kitchen Lack of Transportation (Non-Medical):   Physical Activity:   . Days of Exercise per Week:   . Minutes of Exercise per Session:   Stress:   . Feeling of Stress :   Social Connections:   . Frequency of Communication with Friends and Family:   . Frequency of Social Gatherings with Friends and Family:   . Attends Religious Services:   . Active Member of Clubs or Organizations:   . Attends Banker Meetings:   Marland Kitchen Marital Status:   Intimate Partner Violence:   . Fear of Current or Ex-Partner:   . Emotionally Abused:   Marland Kitchen Physically Abused:   . Sexually Abused:     Review of systems: Review of Systems  Constitutional: Negative for fever and chills.  HENT: Negative.   Eyes: Negative for blurred vision.  Respiratory: as per HPI  Cardiovascular: Negative for chest pain and palpitations.  Gastrointestinal: Negative for vomiting, diarrhea, blood per rectum. Genitourinary: Negative for dysuria, urgency, frequency and hematuria.  Musculoskeletal: Negative for myalgias, back pain and joint pain.  Skin: Negative for itching and rash.  Neurological: Negative for dizziness, tremors, focal weakness, seizures and loss of consciousness.  Endo/Heme/Allergies: Negative for environmental allergies.  Psychiatric/Behavioral: Negative for depression, suicidal ideas and hallucinations.  All other systems reviewed and are negative.  Physical Exam: There were no vitals taken for this visit. General: NAD, nl appearance HE: Normocephalic, atraumatic , EOMI, Conjunctivae normal ENT: No congestion, no rhinorrhea, no exudate or erythema  Cardiovascular: Normal rate, regular rhythm.  No murmurs, rubs, or gallops Pulmonary : Effort normal, breath  sounds normal. No wheezes, rales, or rhonchi Abdominal: soft, nontender,  bowel sounds present Musculoskeletal: no swelling , deformity, injury ,or tenderness in extremities, Skin: Warm, dry , no bruising, erythema, or rash Psychiatric/Behavioral:  normal mood, normal behavior   Data Reviewed: Imaging:    Chest xray 02/2015:  Normal chest xray   PFTs: FVC 3.06 (82%) FEV1 2.49 (83%) F/F 81 (100%) TLC 4.87 ( 93%), DLCO 21.42 (83%)  Labs:  CBC 10/06/2019: WBC 9.6, Hgb 14.4, eosinophil 1%   Assessment:  Joan Leon is a 49 year old person here for evaluation of symptoms consistent with exercised induced bronchospasm. Review previous notes , PFT's and methacholine challenge test. Patient does not have asthma. No CT Chest on record , plan to order hight resolution CT Chest to better evaluate lung parenchyma. In addition we will  order cardiopulmonary exercise test to evaluate for exercised induced bronchospasm.   Plan/Recommendations: - High resolution CT Chest -  Cardiopulmonary exercise test  Tamsen Snider, MD PGY1   Attending note: I have seen and examined the patient. History, labs and imaging reviewed. Agree with assessment and plan. 49 year old with GERD, vocal cord atrophy/dysfunction, remote remote mold exposure in 2012 Has seen Dr. Joya Gaskins at [facility] and given speech therapy, gabapentin for neurogenic cough with improvement in symptoms She continues to have dyspnea that occurs only on running, exercise. She has followed up with GI and allergy with normal testing.  Due to persistent symptoms and concern from the patient about possible mold induced lung issues will work-up further with high-resolution CT Regarding exercise-induced asthma she will be scheduled for a cardiopulmonary exercise test  Marshell Garfinkel MD [facility] 12/04/2019, 11:49 AM   CC: Nickola Major, MD

## 2019-12-04 NOTE — Patient Instructions (Addendum)
Thank you for trusting [place] with your care. To recap, today we discussed shortness of breath and wheezing you notice when running up hills. Your previous pulmonary function test and methacholine challenge test were reviewed and you do not have asthma. To see if you have exercised induce bronchospasm we will order a cardiopulmonary exercise test  We will also get a high-resolution CT for better evaluation of the lungs Follow-up in 1 to 2 months for review.  Alert

## 2019-12-05 ENCOUNTER — Telehealth (HOSPITAL_COMMUNITY): Payer: Self-pay | Admitting: Vascular Surgery

## 2019-12-05 NOTE — Telephone Encounter (Signed)
LVM to schedule cpx

## 2019-12-12 ENCOUNTER — Telehealth (HOSPITAL_COMMUNITY): Payer: Self-pay | Admitting: Vascular Surgery

## 2019-12-12 NOTE — Telephone Encounter (Signed)
called pt to schedule CPX, PT states she can schedule appt at this time pt will call back when she can schedule

## 2020-01-04 ENCOUNTER — Other Ambulatory Visit: Payer: BC Managed Care – PPO

## 2020-01-30 ENCOUNTER — Ambulatory Visit (INDEPENDENT_AMBULATORY_CARE_PROVIDER_SITE_OTHER)
Admission: RE | Admit: 2020-01-30 | Discharge: 2020-01-30 | Disposition: A | Discharge status: Home / Self Care | Payer: BC Managed Care – PPO | Source: Ambulatory Visit | Attending: Pulmonary Disease | Admitting: Pulmonary Disease

## 2020-01-30 ENCOUNTER — Other Ambulatory Visit: Payer: Self-pay

## 2020-01-30 DIAGNOSIS — J9801 Acute bronchospasm: Secondary | ICD-10-CM | POA: Diagnosis not present

## 2020-02-26 NOTE — Progress Notes (Signed)
Attempted to call patient. Patient did not answer and I was unable to leave a voicemail at this time due to it being full.

## 2020-02-28 ENCOUNTER — Encounter: Payer: Self-pay | Admitting: *Deleted

## 2022-03-08 IMAGING — CT CT CHEST HIGH RESOLUTION W/O CM
2 of 7 series · 14 of 36 positions shown, 17 images · non-contrast
Comparison: 02/28/2015 chest radiograph.

CLINICAL DATA: Persistent dyspnea with exertion. History of multi
spacer.

EXAM:
CT CHEST WITHOUT CONTRAST
TECHNIQUE: Multidetector CT imaging of the chest was performed following the
standard protocol without intravenous contrast. High resolution
imaging of the lungs, as well as inspiratory and expiratory imaging,
was performed.

[Series 5: high resolution · axial · 0.62mm/px · z∈[-326,-58]mm · 11 of 322 slices shown, 14 images]
[im 27/322  mediastinal]
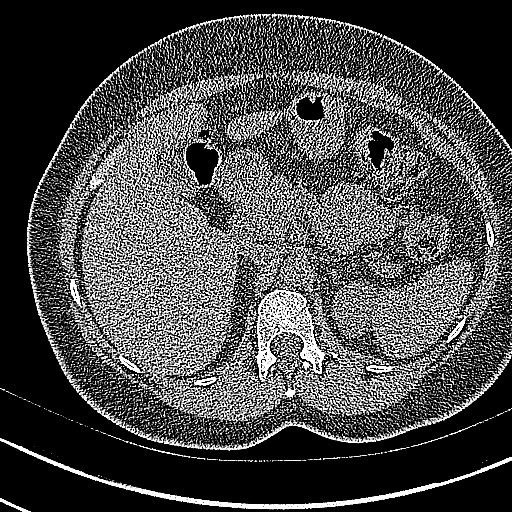
[im 27/322  lung]
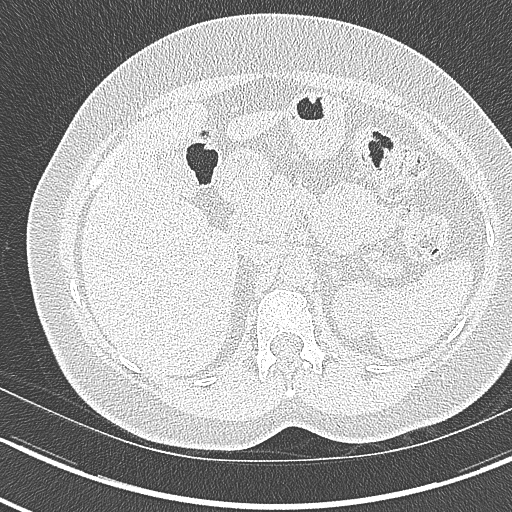
[im 54/322  lung]
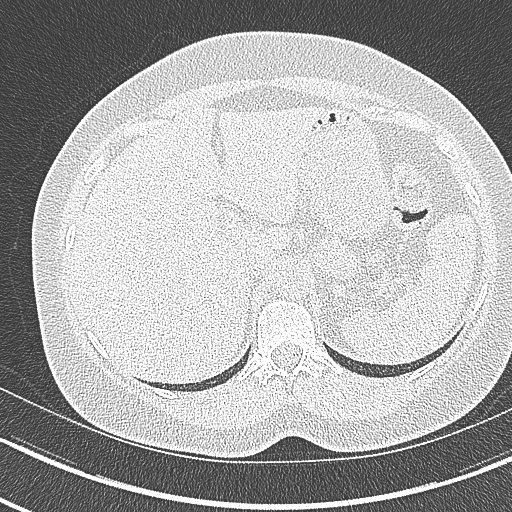
[im 81/322  lung]
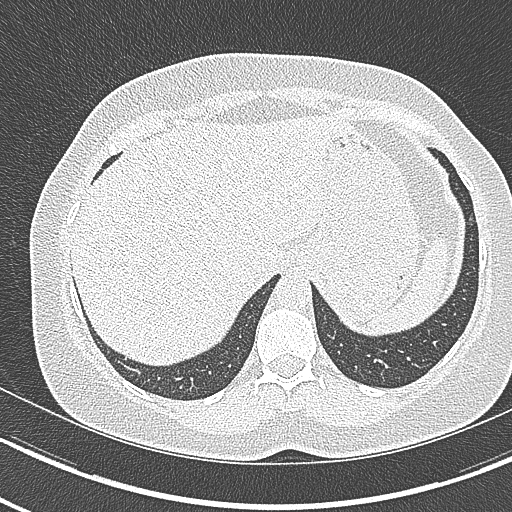
[im 108/322  lung]
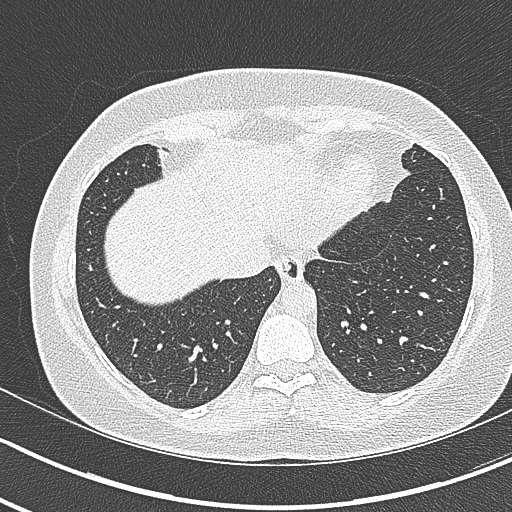
[im 134/322  mediastinal]
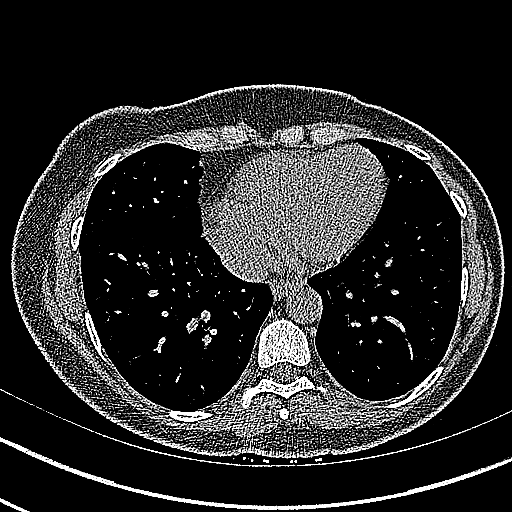
[im 134/322  lung]
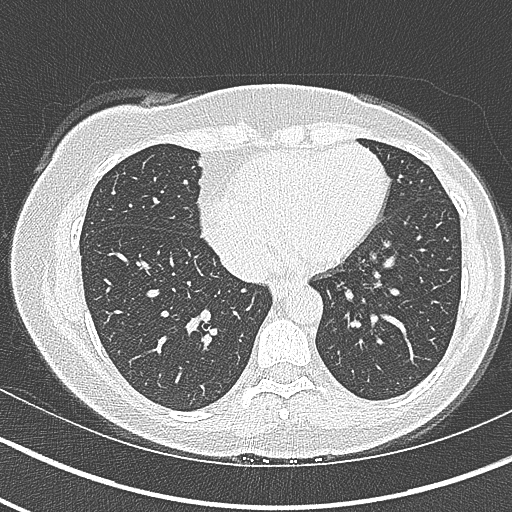
[im 161/322  lung]
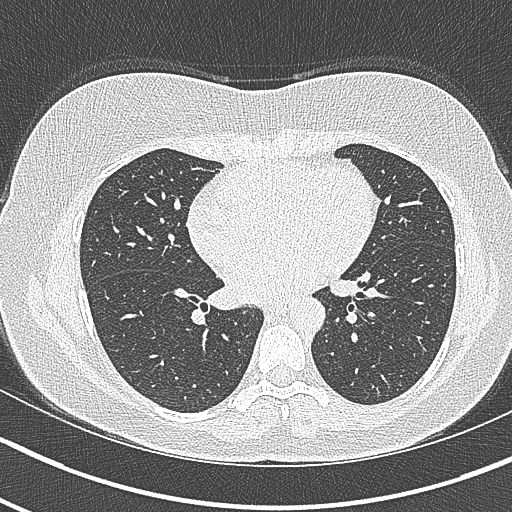
[im 188/322  lung]
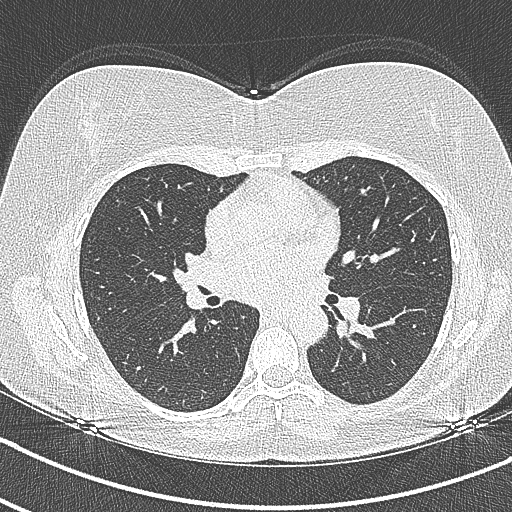
[im 215/322  lung]
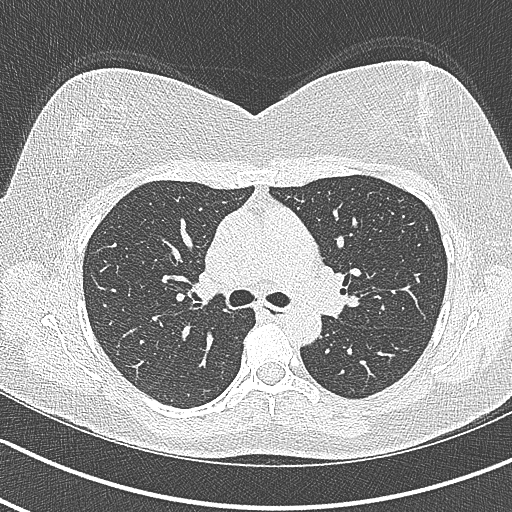
[im 241/322  mediastinal]
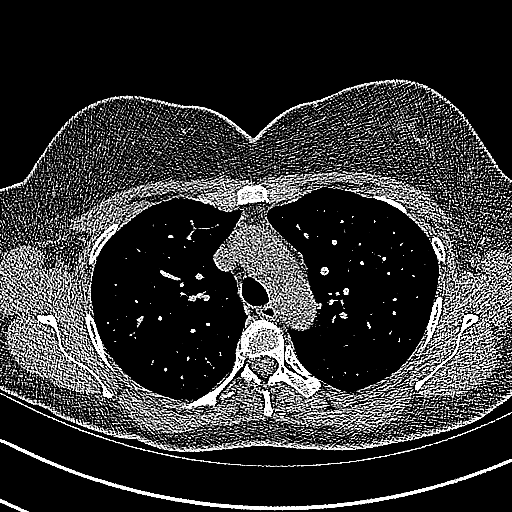
[im 241/322  lung]
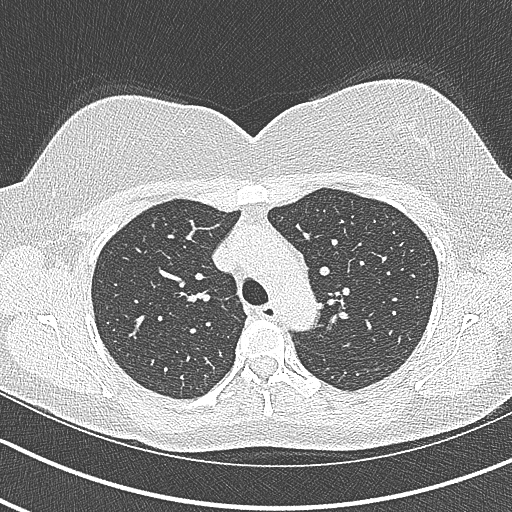
[im 268/322  lung]
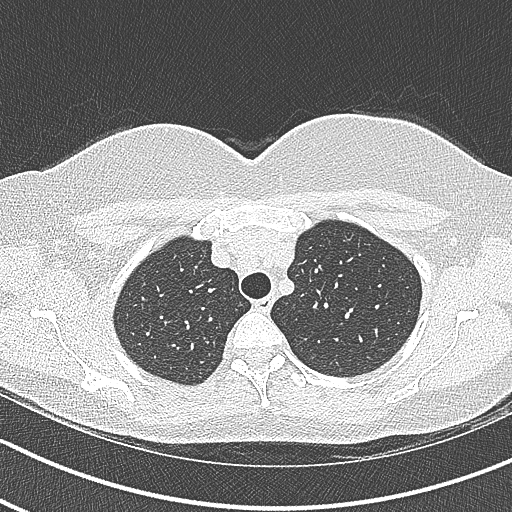
[im 295/322  lung]
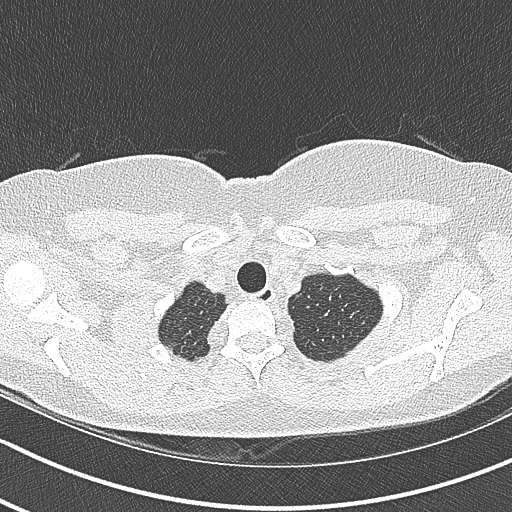

[Series 7: coronal · coronal · 0.59mm/px · 3 of 107 slices shown]
[im 22/107  lung]
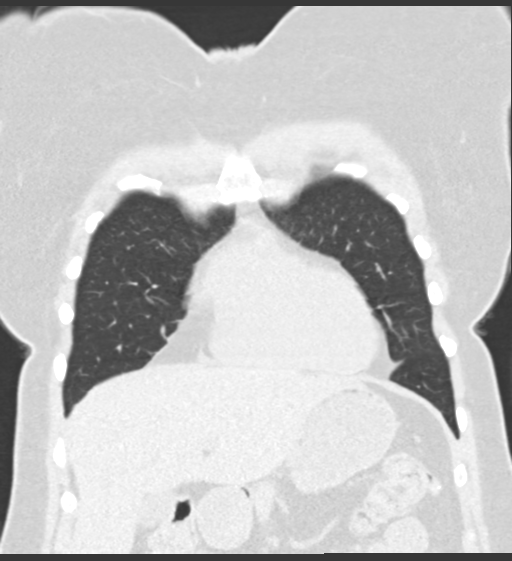
[im 43/107  lung]
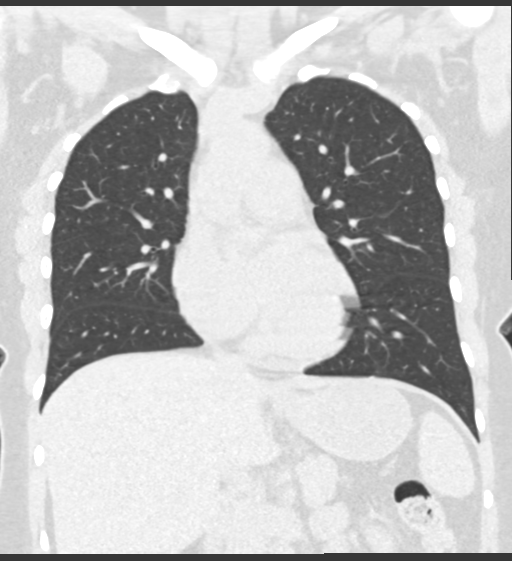
[im 64/107  lung]
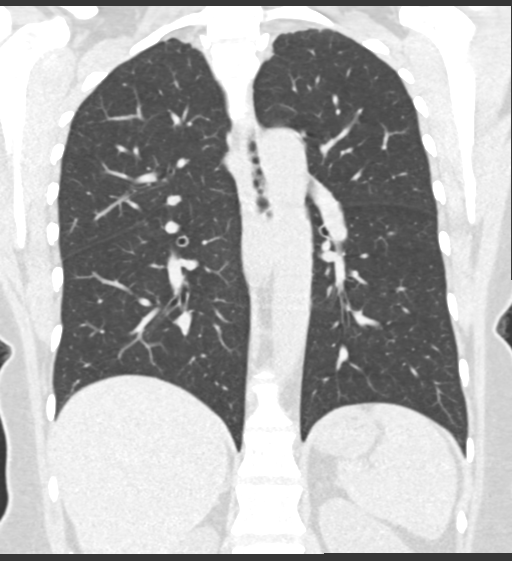

[14 of 36 positions shown; findings below may reference images not displayed]

FINDINGS: Cardiovascular: Normal heart size. No significant pericardial
effusion/thickening. Normal course and caliber of the thoracic
aorta. Dilated main pulmonary artery (3.5 cm diameter).

Mediastinum/Nodes: No discrete thyroid nodules. Unremarkable
esophagus. No pathologically enlarged axillary, mediastinal or hilar
lymph nodes, noting limited sensitivity for the detection of hilar
adenopathy on this noncontrast study.

Lungs/Pleura: No pneumothorax. No pleural effusion. No acute
consolidative airspace disease or lung masses. Solid peripheral
right lower lobe 2 mm solid pulmonary nodule (series 4/image 88). No
additional significant pulmonary nodules. No significant regions of
air trapping or evidence of tracheobronchomalacia on the expiration
sequence. No significant regions of subpleural reticulation,
ground-glass attenuation, traction bronchiectasis, parenchymal
banding, architectural distortion or frank honeycombing.

Upper abdomen: No acute abnormality.

Musculoskeletal:  No aggressive appearing focal osseous lesions.
IMPRESSION: 1. No evidence of interstitial lung disease.
2. Dilated main pulmonary artery, suggesting pulmonary arterial
hypertension.
3. Solitary 2 mm solid right lower lobe pulmonary nodule. No
follow-up needed if patient is low-risk. Non-contrast chest CT can
be considered in 12 months if patient is high-risk. This
recommendation follows the consensus statement: Guidelines for
Management of Incidental Pulmonary Nodules Detected on CT Images:
